# Patient Record
Sex: Female | Born: 1980
Health system: Southern US, Community
[De-identification: ages and names within clinical notes are randomized; demographics above are authoritative.]

## PROBLEM LIST (undated history)

## (undated) DIAGNOSIS — G43909 Migraine, unspecified, not intractable, without status migrainosus: Secondary | ICD-10-CM

## (undated) DIAGNOSIS — R87610 Atypical squamous cells of undetermined significance on cytologic smear of cervix (ASC-US): Secondary | ICD-10-CM

## (undated) DIAGNOSIS — Z1509 Genetic susceptibility to other malignant neoplasm: Principal | ICD-10-CM

## (undated) DIAGNOSIS — Z9289 Personal history of other medical treatment: Secondary | ICD-10-CM

## (undated) DIAGNOSIS — Z803 Family history of malignant neoplasm of breast: Secondary | ICD-10-CM

## (undated) DIAGNOSIS — Z1501 Genetic susceptibility to malignant neoplasm of breast: Principal | ICD-10-CM

## (undated) DIAGNOSIS — E559 Vitamin D deficiency, unspecified: Secondary | ICD-10-CM

## (undated) HISTORY — DX: Vitamin D deficiency, unspecified: E55.9

## (undated) HISTORY — DX: Personal history of other medical treatment: Z92.89

## (undated) HISTORY — DX: Atypical squamous cells of undetermined significance on cytologic smear of cervix (ASC-US): R87.610

## (undated) HISTORY — DX: Migraine, unspecified, not intractable, without status migrainosus: G43.909

## (undated) HISTORY — DX: Genetic susceptibility to malignant neoplasm of breast: Z15.01

## (undated) HISTORY — DX: Family history of malignant neoplasm of breast: Z80.3

## (undated) HISTORY — DX: Genetic susceptibility to other malignant neoplasm: Z15.09

---

## 1999-05-09 HISTORY — PX: CERVICAL BIOPSY  W/ LOOP ELECTRODE EXCISION: SUR135

## 2000-10-24 ENCOUNTER — Other Ambulatory Visit: Admission: RE | Admit: 2000-10-24 | Discharge: 2000-10-24 | Payer: Self-pay | Admitting: Obstetrics and Gynecology

## 2000-10-25 ENCOUNTER — Other Ambulatory Visit: Admission: RE | Admit: 2000-10-25 | Discharge: 2000-10-25 | Payer: Self-pay | Admitting: Obstetrics and Gynecology

## 2000-11-26 ENCOUNTER — Other Ambulatory Visit: Admission: RE | Admit: 2000-11-26 | Discharge: 2000-11-26 | Payer: Self-pay | Admitting: Obstetrics and Gynecology

## 2001-03-04 ENCOUNTER — Other Ambulatory Visit: Admission: RE | Admit: 2001-03-04 | Discharge: 2001-03-04 | Payer: Self-pay | Admitting: Obstetrics and Gynecology

## 2001-06-12 ENCOUNTER — Other Ambulatory Visit: Admission: RE | Admit: 2001-06-12 | Discharge: 2001-06-12 | Payer: Self-pay | Admitting: Obstetrics & Gynecology

## 2001-12-25 ENCOUNTER — Other Ambulatory Visit: Admission: RE | Admit: 2001-12-25 | Discharge: 2001-12-25 | Payer: Self-pay | Admitting: Obstetrics & Gynecology

## 2002-08-26 ENCOUNTER — Other Ambulatory Visit: Admission: RE | Admit: 2002-08-26 | Discharge: 2002-08-26 | Payer: Self-pay | Admitting: Obstetrics & Gynecology

## 2003-09-16 ENCOUNTER — Other Ambulatory Visit: Admission: RE | Admit: 2003-09-16 | Discharge: 2003-09-16 | Payer: Self-pay | Admitting: Obstetrics and Gynecology

## 2005-02-16 ENCOUNTER — Observation Stay: Payer: Self-pay

## 2005-02-19 ENCOUNTER — Observation Stay: Payer: Self-pay | Admitting: Obstetrics & Gynecology

## 2005-02-25 ENCOUNTER — Inpatient Hospital Stay: Payer: Self-pay | Admitting: Unknown Physician Specialty

## 2006-05-08 HISTORY — PX: TUBAL LIGATION: SHX77

## 2006-06-21 ENCOUNTER — Inpatient Hospital Stay: Payer: Self-pay | Admitting: Unknown Physician Specialty

## 2008-07-09 ENCOUNTER — Ambulatory Visit: Payer: Self-pay | Admitting: Unknown Physician Specialty

## 2011-04-20 ENCOUNTER — Ambulatory Visit: Payer: Self-pay

## 2011-05-09 HISTORY — PX: NOVASURE ABLATION: SHX5394

## 2011-12-04 ENCOUNTER — Ambulatory Visit: Payer: Self-pay

## 2011-12-08 ENCOUNTER — Ambulatory Visit: Payer: Self-pay

## 2011-12-08 LAB — PREGNANCY, URINE: Pregnancy Test, Urine: NEGATIVE m[IU]/mL

## 2011-12-14 LAB — PATHOLOGY REPORT

## 2012-05-08 DIAGNOSIS — Z1501 Genetic susceptibility to malignant neoplasm of breast: Secondary | ICD-10-CM | POA: Insufficient documentation

## 2012-05-08 HISTORY — DX: Genetic susceptibility to malignant neoplasm of breast: Z15.01

## 2014-06-04 LAB — HM PAP SMEAR: HM Pap smear: NEGATIVE

## 2014-07-16 ENCOUNTER — Ambulatory Visit (INDEPENDENT_AMBULATORY_CARE_PROVIDER_SITE_OTHER): Payer: 59 | Admitting: General Surgery

## 2014-07-16 ENCOUNTER — Encounter: Payer: Self-pay | Admitting: General Surgery

## 2014-07-16 VITALS — BP 132/70 | HR 62 | Resp 12 | Ht 64.0 in | Wt 162.0 lb

## 2014-07-16 DIAGNOSIS — Z1502 Genetic susceptibility to malignant neoplasm of ovary: Secondary | ICD-10-CM | POA: Diagnosis not present

## 2014-07-16 DIAGNOSIS — Z1501 Genetic susceptibility to malignant neoplasm of breast: Secondary | ICD-10-CM | POA: Diagnosis not present

## 2014-07-16 DIAGNOSIS — Z1509 Genetic susceptibility to other malignant neoplasm: Secondary | ICD-10-CM

## 2014-07-16 DIAGNOSIS — R1011 Right upper quadrant pain: Secondary | ICD-10-CM | POA: Diagnosis not present

## 2014-07-16 NOTE — Patient Instructions (Addendum)
The patient is aware to call back for any questions or concerns.  Abdominal ultrasound Laparoscopic Cholecystectomy Laparoscopic cholecystectomy is surgery to remove the gallbladder. The gallbladder is located in the upper right part of the abdomen, behind the liver. It is a storage sac for bile produced in the liver. Bile aids in the digestion and absorption of fats. Cholecystectomy is often done for inflammation of the gallbladder (cholecystitis). This condition is usually caused by a buildup of gallstones (cholelithiasis) in your gallbladder. Gallstones can block the flow of bile, resulting in inflammation and pain. In severe cases, emergency surgery may be required. When emergency surgery is not required, you will have time to prepare for the procedure. Laparoscopic surgery is an alternative to open surgery. Laparoscopic surgery has a shorter recovery time. Your common bile duct may also need to be examined during the procedure. If stones are found in the common bile duct, they may be removed. LET [facility] CARE PROVIDER KNOW ABOUT:  Any allergies you have.  All medicines you are taking, including vitamins, herbs, eye drops, creams, and over-the-counter medicines.  Previous problems you or members of your family have had with the use of anesthetics.  Any blood disorders you have.  Previous surgeries you have had.  Medical conditions you have. RISKS AND COMPLICATIONS Generally, this is a safe procedure. However, as with any procedure, complications can occur. Possible complications include:  Infection.  Damage to the common bile duct, nerves, arteries, veins, or other internal organs such as the stomach, liver, or intestines.  Bleeding.  A stone may remain in the common bile duct.  A bile leak from the cyst duct that is clipped when your gallbladder is removed.  The need to convert to open surgery, which requires a larger incision in the abdomen. This may be necessary if your  surgeon thinks it is not safe to continue with a laparoscopic procedure. BEFORE THE PROCEDURE  Ask your health care provider about changing or stopping any regular medicines. You will need to stop taking aspirin or blood thinners at least 5 days prior to surgery.  Do not eat or drink anything after midnight the night before surgery.  Let your health care provider know if you develop a cold or other infectious problem before surgery. PROCEDURE   You will be given medicine to make you sleep through the procedure (general anesthetic). A breathing tube will be placed in your mouth.  When you are asleep, your surgeon will make several small cuts (incisions) in your abdomen.  A thin, lighted tube with a tiny camera on the end (laparoscope) is inserted through one of the small incisions. The camera on the laparoscope sends a picture to a TV screen in the operating room. This gives the surgeon a good view inside your abdomen.  A gas will be pumped into your abdomen. This expands your abdomen so that the surgeon has more room to perform the surgery.  Other tools needed for the procedure are inserted through the other incisions. The gallbladder is removed through one of the incisions.  After the removal of your gallbladder, the incisions will be closed with stitches, staples, or skin glue. AFTER THE PROCEDURE  You will be taken to a recovery area where your progress will be checked often.  You may be allowed to go home the same day if your pain is controlled and you can tolerate liquids. Document Released: 04/24/2005 Document Revised: 02/12/2013 Document Reviewed: 12/04/2012 [facility] Patient Information 2015 [place], [place]. This information  is not intended to replace advice given to you by your health care provider. Make sure you discuss any questions you have with your health care provider.  

## 2014-07-16 NOTE — Progress Notes (Addendum)
Patient ID: Shannon George, female   DOB: November 03, 1980, 34 y.o.   MRN: [id number]  Chief Complaint  Patient presents with  . Abdominal Pain    HPI Shannon George is a 34 y.o. female.  Here today for evaluation of right upper quadrant abdominal pain possibly related to her gall bladder. She states she had a "gall bladder attack" Monday 29 and a few since then but nothing like the first attack. Occurred around lunchtime and she had eaten Chick-[employer] Not associated with foods. On Monday she had nausea but no vomiting. No xray's completed. Her mom had breast cancer age 55 and 64.  The patient was genetic positive followed by [employer], NMW.  HPI  Past Medical History  Diagnosis Date  . Migraine   . BRCA gene positive [address], CNM, [facility].    Past Surgical History  Procedure Laterality Date  . Cesarean section  2006, 2008  . Novasure ablation  2013    Family History  Problem Relation Age of Onset  . Cancer Father     lung  . Cancer Mother     breast, 87 and 64    Social History History  Substance Use Topics  . Smoking status: Never Smoker   . Smokeless tobacco: Never Used  . Alcohol Use: No    Allergies  Allergen Reactions  . Penicillins Rash    Current Outpatient Prescriptions  Medication Sig Dispense Refill  . ibuprofen (ADVIL,MOTRIN) 200 MG tablet Take 800 mg by mouth every 6 (six) hours as needed.    . SUMAtriptan (IMITREX) 50 MG tablet Take 50 mg by mouth every 2 (two) hours as needed for migraine. May repeat in 2 hours if headache persists or recurs.     No current facility-administered medications for this visit.    Review of Systems Review of Systems  Constitutional: Negative.   Respiratory: Negative.   Cardiovascular: Negative.   Gastrointestinal: Positive for nausea and abdominal pain. Negative for vomiting, diarrhea, constipation and abdominal distention.    Blood pressure 132/70, pulse 62, resp. rate 12, height _0   (1.626 m), weight 162 lb (73.483 kg), last menstrual period 01/07/2012.  Physical Exam Physical Exam  Constitutional: She is oriented to person, place, and time. She appears well-developed and well-nourished.  Neck: Neck supple.  Cardiovascular: Normal rate, regular rhythm and normal heart sounds.   Pulmonary/Chest: Effort normal and breath sounds normal.  Abdominal: Soft. Normal appearance. There is tenderness in the right upper quadrant.  Lymphadenopathy:    She has no cervical adenopathy.  Neurological: She is alert and oriented to person, place, and time.  Skin: Skin is warm and dry.    Data Reviewed None.  Assessment    History suggestive of biliary colic.    Plan    An abdominal ultrasound will be obtained. If this is notable for cholelithiasis, options for management including can elective cholecystectomy versus dietary modification will be in place.  Should the patient proceed to cholecystectomy, he would be prudent to visualize the ovaries at that time.    Patient to be scheduled for an abdominal ultrasound attn: gallbladder. Due to the late hour, patient will be contacted tomorrow with a day and time when this will take place.   Laparoscopic Cholecystectomy with Intraoperative Cholangiogram. The procedure, including it's potential risks and complications (including but not limited to infection, bleeding, injury to intra-abdominal organs or bile ducts, bile leak, poor cosmetic result, sepsis and death) were discussed  with the patient in detail. Non-operative options, including their inherent risks (acute calculous cholecystitis with possible choledocholithiasis or gallstone pancreatitis, with the risk of ascending cholangitis, sepsis, and death) were discussed as well. The patient expressed and understanding of what we discussed and wishes to proceed with laparoscopic cholecystectomy. The patient further understands that if it is technically not possible, or it is unsafe to  proceed laparoscopically, that I will convert to an open cholecystectomy.  A prescription for Norco 5/325, #30 with the inscription 1-2 by mouth every 4 hours when necessary for pain was provided in the event the patient experienced another severe episode of pain pending diagnostic workup. PCP:  Vevelyn Francois REF: Dr Wynona Luna, [place] 07/18/2014, 8:53 AM

## 2014-07-17 ENCOUNTER — Telehealth: Payer: Self-pay | Admitting: *Deleted

## 2014-07-17 NOTE — Telephone Encounter (Signed)
Patient has been scheduled for a limited abdominal ultrasound at [facility] for 07-22-14 at 8:45 am (arrive 8:30 am). Prep: NPO after midnight.  This patient is aware of date, time, and instructions. She verbalizes understanding.

## 2014-07-18 ENCOUNTER — Encounter: Payer: Self-pay | Admitting: General Surgery

## 2014-07-18 DIAGNOSIS — Z1501 Genetic susceptibility to malignant neoplasm of breast: Secondary | ICD-10-CM | POA: Insufficient documentation

## 2014-07-18 DIAGNOSIS — R1011 Right upper quadrant pain: Secondary | ICD-10-CM | POA: Insufficient documentation

## 2014-07-18 DIAGNOSIS — Z1509 Genetic susceptibility to other malignant neoplasm: Secondary | ICD-10-CM

## 2014-07-22 ENCOUNTER — Ambulatory Visit: Payer: Self-pay | Admitting: General Surgery

## 2014-07-22 ENCOUNTER — Encounter: Payer: Self-pay | Admitting: General Surgery

## 2014-07-23 ENCOUNTER — Telehealth: Payer: Self-pay | Admitting: *Deleted

## 2014-07-23 NOTE — Telephone Encounter (Signed)
-----   Message from Robert Bellow, MD sent at 07/22/2014 10:03 PM EDT ----- These notify the patient at the abdominal ultrasound did not show stones. I would like her to have a HIDA scan with CCK. ----- Message -----    From: Augustin Schooling, CMA    Sent: 07/22/2014   1:39 PM      To: Robert Bellow, MD

## 2014-07-23 NOTE — Telephone Encounter (Signed)
Message for patient to call the office.  

## 2014-07-23 NOTE — Telephone Encounter (Signed)
Patient has been scheduled for a HIDA scan with CCK for next Tuesday, 07-28-14 at 7:30 am (arrive 7:15 am). Prep: NPO 6 hours prior including medications, no smoking, no gum, and no narcotic pain medications the morning of scan.  This patient is aware of date, time, and instructions. She verbalizes understanding.

## 2014-07-28 ENCOUNTER — Ambulatory Visit: Payer: Self-pay | Admitting: General Surgery

## 2014-08-04 ENCOUNTER — Ambulatory Visit: Payer: Self-pay | Admitting: General Surgery

## 2014-08-05 ENCOUNTER — Encounter: Payer: Self-pay | Admitting: General Surgery

## 2014-08-10 ENCOUNTER — Encounter: Payer: Self-pay | Admitting: General Surgery

## 2014-08-10 ENCOUNTER — Other Ambulatory Visit: Payer: Self-pay

## 2014-08-10 ENCOUNTER — Telehealth: Payer: Self-pay | Admitting: *Deleted

## 2014-08-10 DIAGNOSIS — R1011 Right upper quadrant pain: Secondary | ICD-10-CM

## 2014-08-10 MED ORDER — DICYCLOMINE HCL 20 MG PO TABS: 20.0000 mg | ORAL_TABLET | Freq: Four times a day (QID) | ORAL | Status: DC

## 2014-08-10 NOTE — Telephone Encounter (Signed)
Patient returned your call about wanting to start medication bentyl? She thought about it and would like to try it.

## 2014-08-10 NOTE — Progress Notes (Signed)
The patient's ultrasound was negative for cholelithiasis.  HIDA scan showed an ejection fraction of 88% with reproduction of her symptoms, pain 6-7/10 in the right upper quadrant. It is possible that the forceful contractions of the gallbladder are responsible for her pain. Prior to considering cholecystectomy, the patient was offered a trial of Bentyl, 20 mg by mouth 4 times a day for a 2 week course to see if this would relieve her symptoms and provide long-lasting relief after was good discontinued. She is decided this is a reasonable course and a prescription for the same will be sent to her pharmacy.  We'll arrange for a follow-up visit in 3 weeks to assess her response to the therapy.

## 2014-08-25 NOTE — Op Note (Signed)
PATIENT NAME:  Shannon George, Shannon George MR#:  [id number] DATE OF BIRTH:  [birth year]  DATE OF PROCEDURE:  12/08/2011  PREOPERATIVE DIAGNOSIS: Menorrhagia.   POSTOPERATIVE DIAGNOSIS: Menorrhagia.  PROCEDURES PERFORMED: Dilation and curettage, hysteroscopy, and NovaSure endometrial ablation.   SURGEON: Wonda Cheng. Laurey Morale, M.D.   OPERATIVE FINDINGS: Fairly good pelvic support. Uterus sounded to 8 cm, cervical length 4 cm, cavity length 4 cm, cavity width 4 cm, power 88 and time 1.59 minutes.   DESCRIPTION OF PROCEDURE: After adequate general anesthesia, the patient was prepped and draped in routine fashion. Examination under anesthesia revealed no abnormal masses. The cervix was grasped with a Edison Nasuti tenaculum and sounded to 8 cm. The cervix was dilated with ease. The uterine cavity was visualized and there were no obvious abnormalities. Uterine cavity was systematically curetted with return of a small amount of normal-appearing tissue. A NovaSure endometrial ablation was then performed with the above settings. The patient tolerated the procedure well and left the Operating Room in good condition. Sponge and needle counts were said to be correct at the end of the procedure.  ____________________________ Wonda Cheng. Laurey Morale, MD pjr:slb D: 12/08/2011 09:49:48 ET T: 12/08/2011 12:19:12 ET JOB#: [id number]  cc: Wonda Cheng. Laurey Morale, MD, <Dictator> Rosina Lowenstein MD ELECTRONICALLY SIGNED 12/10/2011 8:27

## 2014-08-31 ENCOUNTER — Ambulatory Visit: Payer: 59 | Admitting: General Surgery

## 2014-09-02 ENCOUNTER — Ambulatory Visit (INDEPENDENT_AMBULATORY_CARE_PROVIDER_SITE_OTHER): Payer: 59 | Admitting: General Surgery

## 2014-09-02 ENCOUNTER — Encounter: Payer: Self-pay | Admitting: General Surgery

## 2014-09-02 VITALS — BP 124/74 | HR 80 | Resp 12 | Ht 64.0 in | Wt 168.0 lb

## 2014-09-02 DIAGNOSIS — Z1501 Genetic susceptibility to malignant neoplasm of breast: Secondary | ICD-10-CM | POA: Diagnosis not present

## 2014-09-02 DIAGNOSIS — Z1502 Genetic susceptibility to malignant neoplasm of ovary: Secondary | ICD-10-CM

## 2014-09-02 DIAGNOSIS — Z1509 Genetic susceptibility to other malignant neoplasm: Secondary | ICD-10-CM

## 2014-09-02 DIAGNOSIS — R1011 Right upper quadrant pain: Secondary | ICD-10-CM | POA: Diagnosis not present

## 2014-09-02 MED ORDER — DICYCLOMINE HCL 20 MG PO TABS: 20.0000 mg | ORAL_TABLET | Freq: Four times a day (QID) | ORAL | Status: DC | PRN

## 2014-09-02 NOTE — Patient Instructions (Addendum)
Patient to only take the Bentyl as needed. Patient to return as needed.

## 2014-09-02 NOTE — Progress Notes (Signed)
Patient ID: Shannon George, female   DOB: 07-Dec-1980, 34 y.o.   MRN: [id number]  Chief Complaint  Patient presents with  . Follow-up    gallbladder    HPI [name] is a 34 y.o. female here today for a three week follow up gallbladder problems. She states she only had one attack since her last visit. She had been making use of Bentyl, 20 mg 4 times a day. The one episode of pain she had was of far last intensity and much shorter duration than in the past. Overall she is improved both in frequency of episodes and severity.    HPI  Past Medical History  Diagnosis Date  . Migraine   . BRCA gene positive [address] CNM, [facility].    Past Surgical History  Procedure Laterality Date  . Cesarean section  2006, 2008  . Novasure ablation  2013    Family History  Problem Relation Age of Onset  . Cancer Father     lung  . Cancer Mother     breast, 38 (medullary carcinoma, poorly differentiated) and 46    Social History History  Substance Use Topics  . Smoking status: Never Smoker   . Smokeless tobacco: Never Used  . Alcohol Use: No    Allergies  Allergen Reactions  . Penicillins Rash    Current Outpatient Prescriptions  Medication Sig Dispense Refill  . dicyclomine (BENTYL) 20 MG tablet Take 1 tablet (20 mg total) by mouth 4 (four) times daily as needed for spasms. Take 1 tabled four times a day for 2 weeks. 120 tablet 1  . fluticasone (FLONASE) 50 MCG/ACT nasal spray   2  . ibuprofen (ADVIL,MOTRIN) 200 MG tablet Take 800 mg by mouth every 6 (six) hours as needed.    . SUMAtriptan (IMITREX) 50 MG tablet Take 50 mg by mouth every 2 (two) hours as needed for migraine. May repeat in 2 hours if headache persists or recurs.     No current facility-administered medications for this visit.    Review of Systems Review of Systems  Constitutional: Negative.   Cardiovascular: Negative.     Blood pressure 124/74, pulse 80, resp. rate 12, height _0   (1.626 m), weight 168 lb (76.204 kg).  Physical Exam Physical Exam  Constitutional: She is oriented to person, place, and time. She appears well-developed and well-nourished.  Cardiovascular: Normal rate, regular rhythm and normal heart sounds.   Pulmonary/Chest: Effort normal and breath sounds normal.  Abdominal: Soft. Normal appearance and bowel sounds are normal. There is no tenderness.    Neurological: She is alert and oriented to person, place, and time.  Skin: Skin is warm and dry.    Data Reviewed HIDA scan showed an ejection fraction of 88% with CCK injection reproducing her symptoms.  Assessment    Hyperkinetic biliary tree.    Plan    We'll plan for the patient uses Bentyl only as needed. If this results in rapid resolution of her symptoms and she continues to have decreasing episodes and severity of right upper quadrant pain, no further intervention would be planned. Should she have persistent symptoms, cholecystectomy could be considered all of there is no guarantee that this will relieve her pain. Based on review of her clinical symptoms I think there is little benefit from upper and/or lower endoscopy at this time.      PCP:  Theda Sers 09/03/2014, 1:07 PM

## 2015-04-09 ENCOUNTER — Other Ambulatory Visit: Payer: Self-pay | Admitting: Certified Nurse Midwife

## 2015-04-09 DIAGNOSIS — Z1231 Encounter for screening mammogram for malignant neoplasm of breast: Secondary | ICD-10-CM

## 2015-05-07 ENCOUNTER — Inpatient Hospital Stay
Admission: RE | Admit: 2015-05-07 | Discharge: 2015-05-07 | Disposition: A | Discharge status: Home / Self Care | Payer: Self-pay | Source: Ambulatory Visit | Attending: *Deleted | Admitting: *Deleted

## 2015-05-07 ENCOUNTER — Other Ambulatory Visit: Payer: Self-pay | Admitting: *Deleted

## 2015-05-07 DIAGNOSIS — Z9289 Personal history of other medical treatment: Secondary | ICD-10-CM

## 2015-05-19 ENCOUNTER — Ambulatory Visit
Admission: RE | Admit: 2015-05-19 | Discharge: 2015-05-19 | Disposition: A | Discharge status: Home / Self Care | Payer: Commercial Managed Care - HMO | Source: Ambulatory Visit | Attending: Certified Nurse Midwife | Admitting: Certified Nurse Midwife

## 2015-05-19 ENCOUNTER — Other Ambulatory Visit: Payer: Self-pay | Admitting: Certified Nurse Midwife

## 2015-05-19 DIAGNOSIS — Z1231 Encounter for screening mammogram for malignant neoplasm of breast: Secondary | ICD-10-CM | POA: Diagnosis present

## 2016-02-16 DIAGNOSIS — N390 Urinary tract infection, site not specified: Secondary | ICD-10-CM | POA: Diagnosis not present

## 2016-03-16 DIAGNOSIS — Z1502 Genetic susceptibility to malignant neoplasm of ovary: Secondary | ICD-10-CM | POA: Diagnosis not present

## 2016-03-16 DIAGNOSIS — Z01419 Encounter for gynecological examination (general) (routine) without abnormal findings: Secondary | ICD-10-CM | POA: Diagnosis not present

## 2016-03-16 DIAGNOSIS — Z1321 Encounter for screening for nutritional disorder: Secondary | ICD-10-CM | POA: Diagnosis not present

## 2016-03-16 DIAGNOSIS — Z1501 Genetic susceptibility to malignant neoplasm of breast: Secondary | ICD-10-CM | POA: Diagnosis not present

## 2016-03-16 DIAGNOSIS — Z0000 Encounter for general adult medical examination without abnormal findings: Secondary | ICD-10-CM | POA: Diagnosis not present

## 2016-03-16 DIAGNOSIS — Z13220 Encounter for screening for lipoid disorders: Secondary | ICD-10-CM | POA: Diagnosis not present

## 2016-05-16 DIAGNOSIS — R3915 Urgency of urination: Secondary | ICD-10-CM | POA: Diagnosis not present

## 2016-05-16 DIAGNOSIS — Z1321 Encounter for screening for nutritional disorder: Secondary | ICD-10-CM | POA: Diagnosis not present

## 2016-05-16 DIAGNOSIS — Z13220 Encounter for screening for lipoid disorders: Secondary | ICD-10-CM | POA: Diagnosis not present

## 2016-05-16 DIAGNOSIS — Z1502 Genetic susceptibility to malignant neoplasm of ovary: Secondary | ICD-10-CM | POA: Diagnosis not present

## 2016-05-16 DIAGNOSIS — Z0000 Encounter for general adult medical examination without abnormal findings: Secondary | ICD-10-CM | POA: Diagnosis not present

## 2016-05-16 DIAGNOSIS — Z1501 Genetic susceptibility to malignant neoplasm of breast: Secondary | ICD-10-CM | POA: Diagnosis not present

## 2016-05-16 DIAGNOSIS — Z01419 Encounter for gynecological examination (general) (routine) without abnormal findings: Secondary | ICD-10-CM | POA: Diagnosis not present

## 2016-05-18 ENCOUNTER — Other Ambulatory Visit: Payer: Self-pay | Admitting: Obstetrics and Gynecology

## 2016-05-18 DIAGNOSIS — Z1231 Encounter for screening mammogram for malignant neoplasm of breast: Secondary | ICD-10-CM

## 2016-05-19 ENCOUNTER — Ambulatory Visit
Admission: RE | Admit: 2016-05-19 | Discharge: 2016-05-19 | Disposition: A | Discharge status: Home / Self Care | Payer: 59 | Source: Ambulatory Visit | Attending: Obstetrics and Gynecology | Admitting: Obstetrics and Gynecology

## 2016-05-19 DIAGNOSIS — Z1231 Encounter for screening mammogram for malignant neoplasm of breast: Secondary | ICD-10-CM | POA: Diagnosis not present

## 2016-05-19 LAB — HM MAMMOGRAPHY

## 2016-05-22 DIAGNOSIS — Z1502 Genetic susceptibility to malignant neoplasm of ovary: Secondary | ICD-10-CM | POA: Diagnosis not present

## 2016-05-22 DIAGNOSIS — Z1501 Genetic susceptibility to malignant neoplasm of breast: Secondary | ICD-10-CM | POA: Diagnosis not present

## 2016-08-16 DIAGNOSIS — E559 Vitamin D deficiency, unspecified: Secondary | ICD-10-CM | POA: Diagnosis not present

## 2016-11-16 ENCOUNTER — Other Ambulatory Visit: Payer: Self-pay | Admitting: Obstetrics and Gynecology

## 2016-11-16 DIAGNOSIS — Z803 Family history of malignant neoplasm of breast: Secondary | ICD-10-CM

## 2016-11-20 ENCOUNTER — Ambulatory Visit (INDEPENDENT_AMBULATORY_CARE_PROVIDER_SITE_OTHER): Payer: 59 | Admitting: Obstetrics and Gynecology

## 2016-11-20 ENCOUNTER — Encounter: Payer: Self-pay | Admitting: Obstetrics and Gynecology

## 2016-11-20 ENCOUNTER — Ambulatory Visit (INDEPENDENT_AMBULATORY_CARE_PROVIDER_SITE_OTHER): Payer: 59

## 2016-11-20 VITALS — BP 100/60 | HR 74 | Ht 64.0 in | Wt 184.0 lb

## 2016-11-20 DIAGNOSIS — Z1501 Genetic susceptibility to malignant neoplasm of breast: Secondary | ICD-10-CM

## 2016-11-20 DIAGNOSIS — Z803 Family history of malignant neoplasm of breast: Secondary | ICD-10-CM | POA: Diagnosis not present

## 2016-11-20 DIAGNOSIS — Z1509 Genetic susceptibility to other malignant neoplasm: Secondary | ICD-10-CM

## 2016-11-20 DIAGNOSIS — Z9189 Other specified personal risk factors, not elsewhere classified: Secondary | ICD-10-CM

## 2016-11-20 DIAGNOSIS — G4700 Insomnia, unspecified: Secondary | ICD-10-CM | POA: Diagnosis not present

## 2016-11-20 DIAGNOSIS — Z8041 Family history of malignant neoplasm of ovary: Secondary | ICD-10-CM

## 2016-11-20 MED ORDER — ZOLPIDEM TARTRATE 5 MG PO TABS: 5.0000 mg | ORAL_TABLET | Freq: Every evening | ORAL | 0 refills | Status: DC | PRN

## 2016-11-20 NOTE — Progress Notes (Signed)
Chief Complaint  Patient presents with  . Follow-up    f/u GYN u/s and breast exam    HPI:      Shannon George is a 36 y.o. [id number] who LMP was No LMP recorded. Patient has had an ablation., presents today for clinical breast exam due to BRCA 1 positive gene mutation. She had a neg CBE 11/17 at her annual exam. She had a neg mammo 05/19/16. She had adequate Vit D levels 4/18. She had a neg GYN u/s and ca-125 1/18.  She had a GYN u/s today.   She had a recent death in her family and is having trouble sleeping. She would like a Rx for [employer]. She has taken it before without side effects.  Patient Active Problem List   Diagnosis Date Noted  . Abdominal pain, right upper quadrant 07/18/2014  . BRCA gene positive 07/18/2014  . BRCA1 gene mutation positive 05/08/2012    Family History  Problem Relation Age of Onset  . Cancer Father 2       lung  . Hypertension Mother   . Breast cancer Mother        11 (medullary carcinoma, poorly differentiated) and 94  . Breast cancer Paternal Aunt 39  . Breast cancer Maternal Grandmother 44  . Cancer Paternal Grandmother        BLADDER; KIDNEY  . Cancer Maternal Grandfather        STOMACH    Social History   Social History  . Marital status: Married    Spouse name: [name]  . Number of children: 2  . Years of education: 13   Occupational History  . [employer]     [employer]   Social History Main Topics  . Smoking status: Never Smoker  . Smokeless tobacco: Never Used  . Alcohol use No  . Drug use: No  . Sexual activity: Yes    Birth control/ protection: Pill   Other Topics Concern  . Not on file   Social History Narrative  . No narrative on file     Current Outpatient Prescriptions:  .  dicyclomine (BENTYL) 20 MG tablet, Take 1 tablet (20 mg total) by mouth 4 (four) times daily as needed for spasms. Take 1 tabled four times a day for 2 weeks., Disp: 120 tablet, Rfl: 1 .  fluticasone (FLONASE) 50 MCG/ACT nasal  spray, , Disp: , Rfl: 2 .  ibuprofen (ADVIL,MOTRIN) 200 MG tablet, Take 800 mg by mouth every 6 (six) hours as needed., Disp: , Rfl:  .  Norethindrone-Ethinyl Estradiol-Fe Biphas (LO LOESTRIN FE) 1 MG-10 MCG / 10 MCG tablet, Take 1 tablet by mouth daily., Disp: , Rfl:  .  SUMAtriptan (IMITREX) 50 MG tablet, Take 50 mg by mouth every 2 (two) hours as needed for migraine. May repeat in 2 hours if headache persists or recurs., Disp: , Rfl:  .  zolpidem (AMBIEN) 5 MG tablet, Take 1 tablet (5 mg total) by mouth at bedtime as needed for sleep., Disp: 20 tablet, Rfl: 0  Review of Systems  Constitutional: Negative for fever.  Gastrointestinal: Negative for blood in stool, constipation, diarrhea, nausea and vomiting.  Genitourinary: Negative for dyspareunia, dysuria, flank pain, frequency, hematuria, urgency, vaginal bleeding, vaginal discharge and vaginal pain.  Musculoskeletal: Negative for back pain.  Skin: Negative for rash.     OBJECTIVE:   Vitals:  BP 100/60   Pulse 74   Ht '5\' 4"'  (1.626 m)   Wt 184 lb (83.5  kg)   BMI 31.58 kg/m   Physical Exam  Constitutional: She is oriented to person, place, and time and well-developed, well-nourished, and in no distress.  Pulmonary/Chest: Right breast exhibits no inverted nipple, no mass, no nipple discharge, no skin change and no tenderness. Left breast exhibits no inverted nipple, no mass, no nipple discharge, no skin change and no tenderness. Breasts are symmetrical.  Abdominal: Soft. Normal appearance. There is no tenderness. There is no rebound and no guarding.  Lymphadenopathy:    She has no axillary adenopathy.  Neurological: She is alert and oriented to person, place, and time.  Psychiatric: Affect and judgment normal.  Vitals reviewed.   Results: GYN U/S-->EM=3.34 MM; BILAT OVAR WNL; NO FF IN CDS  Assessment/Plan: BRCA1 gene mutation positive - Neg GYN u/s today, neg CBE. Pt due for mammo 1/19. Pt no longer qualifies for scr breast  mammo this yr but should have Qyr, staggered with mammo. Ca-125 today - Plan: CA 125  Increased risk of breast cancer  Insomnia, unspecified type - Rx ambien. F/u prn. Use sparingly. - Plan: zolpidem (AMBIEN) 5 MG tablet     Meds ordered this encounter  Medications  . zolpidem (AMBIEN) 5 MG tablet    Sig: Take 1 tablet (5 mg total) by mouth at bedtime as needed for sleep.    Dispense:  20 tablet    Refill:  0      Return in about 6 months (around 4/91/7915) for annual.  Chaunice Obie B. Griffen Frayne, PA-C 11/20/2016 11:00 AM

## 2016-11-21 LAB — CA 125: Cancer Antigen (CA) 125: 27.8 U/mL (ref 0.0–38.1)

## 2017-01-16 ENCOUNTER — Other Ambulatory Visit: Payer: Self-pay | Admitting: Obstetrics and Gynecology

## 2017-01-16 DIAGNOSIS — G4700 Insomnia, unspecified: Secondary | ICD-10-CM

## 2017-01-16 MED ORDER — ZOLPIDEM TARTRATE 5 MG PO TABS: 5.0000 mg | ORAL_TABLET | Freq: Every evening | ORAL | 0 refills | Status: DC | PRN

## 2017-01-16 NOTE — Progress Notes (Signed)
Rx RF for insomnia.

## 2017-02-27 DIAGNOSIS — H5213 Myopia, bilateral: Secondary | ICD-10-CM | POA: Diagnosis not present

## 2017-02-28 ENCOUNTER — Other Ambulatory Visit: Payer: Self-pay | Admitting: Obstetrics and Gynecology

## 2017-02-28 DIAGNOSIS — G4700 Insomnia, unspecified: Secondary | ICD-10-CM

## 2017-02-28 MED ORDER — ZOLPIDEM TARTRATE 5 MG PO TABS: 5.0000 mg | ORAL_TABLET | Freq: Every evening | ORAL | 0 refills | Status: DC | PRN

## 2017-02-28 NOTE — Progress Notes (Signed)
Rx RF. 

## 2017-03-20 ENCOUNTER — Encounter: Payer: Self-pay | Admitting: Obstetrics and Gynecology

## 2017-03-20 ENCOUNTER — Ambulatory Visit (INDEPENDENT_AMBULATORY_CARE_PROVIDER_SITE_OTHER): Payer: 59 | Admitting: Obstetrics and Gynecology

## 2017-03-20 VITALS — BP 100/60 | HR 69 | Ht 64.0 in | Wt 185.0 lb

## 2017-03-20 DIAGNOSIS — Z1501 Genetic susceptibility to malignant neoplasm of breast: Secondary | ICD-10-CM | POA: Diagnosis not present

## 2017-03-20 DIAGNOSIS — F419 Anxiety disorder, unspecified: Secondary | ICD-10-CM | POA: Diagnosis not present

## 2017-03-20 DIAGNOSIS — Z3041 Encounter for surveillance of contraceptive pills: Secondary | ICD-10-CM

## 2017-03-20 DIAGNOSIS — Z1231 Encounter for screening mammogram for malignant neoplasm of breast: Secondary | ICD-10-CM | POA: Diagnosis not present

## 2017-03-20 DIAGNOSIS — Z01419 Encounter for gynecological examination (general) (routine) without abnormal findings: Secondary | ICD-10-CM

## 2017-03-20 DIAGNOSIS — Z1509 Genetic susceptibility to other malignant neoplasm: Secondary | ICD-10-CM

## 2017-03-20 DIAGNOSIS — Z1239 Encounter for other screening for malignant neoplasm of breast: Secondary | ICD-10-CM

## 2017-03-20 MED ORDER — IBUPROFEN 800 MG PO TABS: 800.0000 mg | ORAL_TABLET | Freq: Three times a day (TID) | ORAL | 1 refills | Status: DC | PRN

## 2017-03-20 MED ORDER — LORAZEPAM 0.5 MG PO TABS: 0.5000 mg | ORAL_TABLET | Freq: Three times a day (TID) | ORAL | 0 refills | Status: DC | PRN

## 2017-03-20 NOTE — Patient Instructions (Signed)
I value your feedback and entrusting us with your care. If you get a [facility] patient survey, I would appreciate you taking the time to let us know about your experience today. Thank you! 

## 2017-03-20 NOTE — Progress Notes (Signed)
PCP:  Vevelyn Francois, NP   Chief Complaint  Patient presents with  . Gynecologic Exam     HPI:      [name] is a 36 y.o. [id number] who LMP was No LMP recorded. Patient has had an ablation., presents today for her annual examination.  Her menses are absent due to endometrial ablation. She does occsa have intermenstrual bleeding.  Sex activity: single partner, contraception - tubal ligation.  Last Pap: May 05, 2015  Results were: no abnormalities /neg HPV DNA 12/15. Hx of STDs: HPV on cx  Last mammogram: May 19, 2016  Results were: normal--routine follow-up in 12 months There is a FH of breast cancer in her mom, MGM, and pat aunt. There is no FH of ovarian cancer. The patient does do self-breast exams. PT IS BRCA 1 POS. She is doing yearly mammo. Has not had a screening breast MRI. She is taking Vit D supp 5000 IU daily due to deficiency in past, normal level 4/18. She is taking OCPs for ovar cancer prevention and gets Q6 mo ca-125 and GYN u/s. Due 1/19 for both. She has not been interested in BSO in the past.   Tobacco use: The patient denies current or previous tobacco use. Alcohol use: none No drug use.  Exercise: moderately active  She does get adequate calcium and Vitamin D in her diet.  She complains of recent anxiety and difficulty sleeping due to fam stressors with her daughter. She is taking [place] sparingly with insomnia relief but feels she needs something for really bad days. She doesn't need something daily.   She needs Rx RF on ibup 800 mg.   She had normal lipids last yr.  Past Medical History:  Diagnosis Date  . BRCA1 gene mutation positive 2014   [facility]  . Family history of breast cancer   . History of Papanicolaou smear of cervix 04/24/13; 06/04/14   -/+; neg  . Migraine   . Pap smear abnormality of cervix with ASCUS favoring dysplasia   . Vitamin D deficiency     Past Surgical History:  Procedure Laterality Date  . CERVICAL  BIOPSY  W/ LOOP ELECTRODE EXCISION  2001   [place]  . CESAREAN SECTION  2006, 2008  . [place] ABLATION  2013   [facility]  . TUBAL LIGATION  2008    Family History  Problem Relation Age of Onset  . Cancer Father 13       lung  . Hypertension Mother   . Breast cancer Mother        12 (medullary carcinoma, poorly differentiated) and 58  . Breast cancer Paternal Aunt 20  . Breast cancer Maternal Grandmother 1  . Cancer Paternal Grandmother        BLADDER; KIDNEY  . Cancer Maternal Grandfather        STOMACH    Social History   Socioeconomic History  . Marital status: Married    Spouse name: [name]  . Number of children: 2  . Years of education: 49  . Highest education level: Not on file  Social Needs  . Financial resource strain: Not on file  . Food insecurity - worry: Not on file  . Food insecurity - inability: Not on file  . Transportation needs - medical: Not on file  . Transportation needs - non-medical: Not on file  Occupational History  . 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 Smoking status: Never  Smoker  . Smokeless tobacco: Never Used  Substance and Sexual Activity  . Alcohol use: No    Alcohol/week: 0.0 oz  . Drug use: No  . Sexual activity: Yes    Birth control/protection: None  Other Topics Concern  . Not on file  Social History Narrative  . Not on file    Current Meds  Medication Sig  . dicyclomine (BENTYL) 20 MG tablet Take 1 tablet (20 mg total) by mouth 4 (four) times daily as needed for spasms. Take 1 tabled four times a day for 2 weeks.  . fluticasone (FLONASE) 50 MCG/ACT nasal spray   . ibuprofen (ADVIL,MOTRIN) 800 MG tablet Take 1 tablet (800 mg total) every 8 (eight) hours as needed by mouth.  . SUMAtriptan (IMITREX) 50 MG tablet Take 50 mg by mouth every 2 (two) hours as needed for migraine. May repeat in 2 hours if headache persists or recurs.  Marland Kitchen zolpidem (AMBIEN) 5 MG tablet Take 1 tablet (5 mg total) by mouth at  bedtime as needed for sleep.  . [DISCONTINUED] ibuprofen (ADVIL,MOTRIN) 200 MG tablet Take 800 mg by mouth every 6 (six) hours as needed.     ROS:  Review of Systems  Constitutional: Negative for fatigue, fever and unexpected weight change.  Respiratory: Negative for cough, shortness of breath and wheezing.   Cardiovascular: Negative for chest pain, palpitations and leg swelling.  Gastrointestinal: Negative for blood in stool, constipation, diarrhea, nausea and vomiting.  Endocrine: Negative for cold intolerance, heat intolerance and polyuria.  Genitourinary: Negative for dyspareunia, dysuria, flank pain, frequency, genital sores, hematuria, menstrual problem, pelvic pain, urgency, vaginal bleeding, vaginal discharge and vaginal pain.  Musculoskeletal: Negative for back pain, joint swelling and myalgias.  Skin: Negative for rash.  Neurological: Positive for headaches. Negative for dizziness, syncope, light-headedness and numbness.  Hematological: Negative for adenopathy.  Psychiatric/Behavioral: Negative for agitation, confusion, sleep disturbance and suicidal ideas. The patient is not nervous/anxious.      Objective: BP 100/60   Pulse 69   Ht '5\' 4"'  (1.626 m)   Wt 185 lb (83.9 kg)   BMI 31.76 kg/m    Physical Exam  Constitutional: She is oriented to person, place, and time. She appears well-developed and well-nourished.  Genitourinary: Vagina normal and uterus normal. There is no rash or tenderness on the right labia. There is no rash or tenderness on the left labia. No erythema or tenderness in the vagina. No vaginal discharge found. Right adnexum does not display mass and does not display tenderness. Left adnexum does not display mass and does not display tenderness. Cervix does not exhibit motion tenderness or polyp. Uterus is not enlarged or tender.  Neck: Normal range of motion. No thyromegaly present.  Cardiovascular: Normal rate, regular rhythm and normal heart sounds.  No  murmur heard. Pulmonary/Chest: Effort normal and breath sounds normal. Right breast exhibits no mass, no nipple discharge, no skin change and no tenderness. Left breast exhibits no mass, no nipple discharge, no skin change and no tenderness.  Abdominal: Soft. There is no tenderness. There is no guarding.  Musculoskeletal: Normal range of motion.  Neurological: She is alert and oriented to person, place, and time. No cranial nerve deficit.  Psychiatric: She has a normal mood and affect. Her behavior is normal.  Vitals reviewed.   Assessment/Plan: Encounter for annual routine gynecological examination  Screening for breast cancer - Pt to sched mammo. Will f/u for scr breast MRI within 6 months of mammo. RTO in 6 months  for CBE. - Plan: MM SCREENING BREAST TOMO BILATERAL  Anxiety - Try Rx ativan sparingly. Do not take with ambien. F/u prn. - Plan: LORazepam (ATIVAN) 0.5 MG tablet  Encounter for surveillance of contraceptive pills - Pt on Lo Loestrin. Gets samples.  BRCA gene positive - Pt doing monthly SBE, Q6 mo CBE, yearly mammos and offered scr breast MRI. Cont Vit D supp. Pt on OCPs for ovar, getting Q6 mo GYN u/s and ca-125. - Plan: CA 125, VITAMIN D 25 Hydroxy (Vit-D Deficiency, Fractures), US PELVIS TRANSVANGINAL NON-OB (TV ONLY), MM SCREENING BREAST TOMO BILATERAL, CANCELED: CA 125, CANCELED: US PELVIS TRANSVANGINAL NON-OB (TV ONLY), CANCELED: VITAMIN D 25 Hydroxy (Vit-D Deficiency, Fractures) Prophylactic BSO offered to pt. Would still qualify for HRT.   Meds ordered this encounter  Medications  . LORazepam (ATIVAN) 0.5 MG tablet    Sig: Take 1 tablet (0.5 mg total) every 8 (eight) hours as needed by mouth for anxiety.    Dispense:  30 tablet    Refill:  0  . ibuprofen (ADVIL,MOTRIN) 800 MG tablet    Sig: Take 1 tablet (800 mg total) every 8 (eight) hours as needed by mouth.    Dispense:  30 tablet    Refill:  1             GYN counsel breast self exam, mammography screening,  use and side effects of OCP's, adequate intake of calcium and vitamin D, diet and exercise     F/U  Return in about 6 months (around 09/17/2017) for breast exam.  Elmo Putt B. Andy Moye, PA-C 03/20/2017 11:19 AM

## 2017-04-19 ENCOUNTER — Other Ambulatory Visit: Payer: Self-pay | Admitting: Obstetrics and Gynecology

## 2017-04-19 MED ORDER — BETAMETHASONE DIPROPIONATE 0.05 % EX CREA: TOPICAL_CREAM | Freq: Two times a day (BID) | CUTANEOUS | 0 refills | Status: DC

## 2017-04-19 NOTE — Progress Notes (Signed)
Rx crm for arm rash, question eczema.

## 2017-04-23 ENCOUNTER — Ambulatory Visit (INDEPENDENT_AMBULATORY_CARE_PROVIDER_SITE_OTHER): Payer: 59 | Admitting: Obstetrics & Gynecology

## 2017-04-23 ENCOUNTER — Encounter: Payer: Self-pay | Admitting: Obstetrics & Gynecology

## 2017-04-23 VITALS — BP 108/70 | HR 82 | Ht 64.0 in | Wt 183.0 lb

## 2017-04-23 DIAGNOSIS — E669 Obesity, unspecified: Secondary | ICD-10-CM

## 2017-04-23 MED ORDER — PHENTERMINE HCL 37.5 MG PO TABS: 37.5000 mg | ORAL_TABLET | Freq: Every day | ORAL | 1 refills | Status: DC

## 2017-04-23 NOTE — Progress Notes (Signed)
  HPI:  Patient is a 36 y.o. [id number] presenting for evaluation of abnormal weight gain.  The patient has gained 20 pounds over the past 2 years..  She feels she has gained weight due to problems with STRESS and her nutrition.  She has these associated symptoms: none.  She has tried prescription appetite suppressants: (Phentermine in past) and self-directed dieting in the past with some success. PMHx: She  has a past medical history of BRCA1 gene mutation positive (2014), Family history of breast cancer, History of Papanicolaou smear of cervix (04/24/13; 06/04/14), Migraine, Pap smear abnormality of cervix with ASCUS favoring dysplasia, and Vitamin D deficiency. Also,  has a past surgical history that includes [place] ablation (2013); Cesarean section (2006, 2008); Cervical biopsy w/ loop electrode excision (2001); and Tubal ligation (2008)., family history includes Breast cancer in her mother; Breast cancer (age of onset: 72) in her paternal aunt; Breast cancer (age of onset: 23) in her maternal grandmother; Cancer in her maternal grandfather and paternal grandmother; Cancer (age of onset: 23) in her father; Hypertension in her mother.,  reports that  has never smoked. she has never used smokeless tobacco. She reports that she does not drink alcohol or use drugs.  She has a current medication list which includes the following prescription(s): betamethasone dipropionate, dicyclomine, fluticasone, ibuprofen, lorazepam, norethindrone-ethinyl estradiol-fe biphas, phentermine, sumatriptan, and zolpidem. Also, is allergic to amoxicillin and penicillins.  Review of Systems  All other systems reviewed and are negative.  Objective: BP 108/70   Pulse 82   Ht 5' 4" (1.626 m)   Wt 183 lb (83 kg)   BMI 31.41 kg/m  Physical Exam  Constitutional: She is oriented to person, place, and time. She appears well-developed and well-nourished. No distress.  Musculoskeletal: Normal range of motion.  Neurological: She is  alert and oriented to person, place, and time.  Skin: Skin is warm and dry.  Psychiatric: She has a normal mood and affect.  Vitals reviewed.  ASSESSMENT:  obesity, BMI 31  Plan: Will assist patient in incorporating positive experiences into her life to promote a positive mental attitude.  Education given regarding appropriate lifestyle changes for weight loss, including regular physical activity, healthy coping strategies, caloric restriction, and healthy eating patterns.  Patient is started on prescription appetite suppressants: Phentermine.    The risks and benefits as well as side effects of medication, such as Phenteramine or [place], is discussed.  The pros and cons of suppressing appetite and boosting metabolism is counseled.  Risks of tolerance and addiction discussed.  Use of medicine will be short term.  Pt to call with any negative side effects and agrees to keep follow up appointments.  Barnett Applebaum, MD, [facility], [facility] 04/23/2017  11:51 AM

## 2017-05-15 ENCOUNTER — Ambulatory Visit (INDEPENDENT_AMBULATORY_CARE_PROVIDER_SITE_OTHER): Payer: 59

## 2017-05-15 DIAGNOSIS — Z1501 Genetic susceptibility to malignant neoplasm of breast: Secondary | ICD-10-CM

## 2017-05-15 DIAGNOSIS — Z1509 Genetic susceptibility to other malignant neoplasm: Secondary | ICD-10-CM

## 2017-05-23 ENCOUNTER — Other Ambulatory Visit: Payer: 59

## 2017-05-23 DIAGNOSIS — Z1501 Genetic susceptibility to malignant neoplasm of breast: Secondary | ICD-10-CM

## 2017-05-23 DIAGNOSIS — Z1509 Genetic susceptibility to other malignant neoplasm: Secondary | ICD-10-CM | POA: Diagnosis not present

## 2017-05-24 LAB — VITAMIN D 25 HYDROXY (VIT D DEFICIENCY, FRACTURES): Vit D, 25-Hydroxy: 30.6 ng/mL (ref 30.0–100.0)

## 2017-05-24 LAB — CA 125: CANCER ANTIGEN (CA) 125: 29.3 U/mL (ref 0.0–38.1)

## 2017-05-28 ENCOUNTER — Ambulatory Visit
Admission: RE | Admit: 2017-05-28 | Discharge: 2017-05-28 | Disposition: A | Discharge status: Home / Self Care | Payer: 59 | Source: Ambulatory Visit | Attending: Obstetrics and Gynecology | Admitting: Obstetrics and Gynecology

## 2017-05-28 DIAGNOSIS — Z1231 Encounter for screening mammogram for malignant neoplasm of breast: Secondary | ICD-10-CM | POA: Diagnosis not present

## 2017-05-28 DIAGNOSIS — R928 Other abnormal and inconclusive findings on diagnostic imaging of breast: Secondary | ICD-10-CM | POA: Diagnosis not present

## 2017-05-28 DIAGNOSIS — Z1509 Genetic susceptibility to other malignant neoplasm: Secondary | ICD-10-CM

## 2017-05-28 DIAGNOSIS — Z1501 Genetic susceptibility to malignant neoplasm of breast: Secondary | ICD-10-CM

## 2017-05-28 DIAGNOSIS — Z1239 Encounter for other screening for malignant neoplasm of breast: Secondary | ICD-10-CM

## 2017-05-29 ENCOUNTER — Other Ambulatory Visit: Payer: Self-pay | Admitting: Obstetrics and Gynecology

## 2017-05-29 DIAGNOSIS — N6489 Other specified disorders of breast: Secondary | ICD-10-CM

## 2017-05-29 DIAGNOSIS — R928 Other abnormal and inconclusive findings on diagnostic imaging of breast: Secondary | ICD-10-CM

## 2017-05-30 ENCOUNTER — Other Ambulatory Visit: Payer: Self-pay | Admitting: Obstetrics & Gynecology

## 2017-05-30 ENCOUNTER — Ambulatory Visit
Admission: RE | Admit: 2017-05-30 | Discharge: 2017-05-30 | Disposition: A | Discharge status: Home / Self Care | Payer: 59 | Source: Ambulatory Visit | Attending: Obstetrics and Gynecology | Admitting: Obstetrics and Gynecology

## 2017-05-30 ENCOUNTER — Other Ambulatory Visit: Payer: Self-pay | Admitting: Obstetrics and Gynecology

## 2017-05-30 DIAGNOSIS — R928 Other abnormal and inconclusive findings on diagnostic imaging of breast: Secondary | ICD-10-CM

## 2017-05-30 DIAGNOSIS — Z803 Family history of malignant neoplasm of breast: Secondary | ICD-10-CM | POA: Insufficient documentation

## 2017-05-30 DIAGNOSIS — N6489 Other specified disorders of breast: Secondary | ICD-10-CM | POA: Insufficient documentation

## 2017-05-30 DIAGNOSIS — Z1231 Encounter for screening mammogram for malignant neoplasm of breast: Secondary | ICD-10-CM

## 2017-05-30 MED ORDER — PHENTERMINE HCL 37.5 MG PO TABS: 37.5000 mg | ORAL_TABLET | Freq: Every day | ORAL | 1 refills | Status: DC

## 2017-05-30 NOTE — Progress Notes (Signed)
Breast MRI for abn mammo f/u/FH breast cancer/increased risk for breast cancer/BRCA 1 pos.

## 2017-05-30 NOTE — Progress Notes (Signed)
Weigh in- patient reports no side effects to Phentermine, doing well with weight loss; following healthy diet and exercise routine. Weight 173 lbs (down from 183 last month) Refill granted Barnett Applebaum, MD, [facility], [facility] 05/30/2017  11:21 AM

## 2017-06-13 ENCOUNTER — Ambulatory Visit
Admission: RE | Admit: 2017-06-13 | Discharge: 2017-06-13 | Disposition: A | Discharge status: Home / Self Care | Payer: 59 | Source: Ambulatory Visit | Attending: Obstetrics and Gynecology | Admitting: Obstetrics and Gynecology

## 2017-06-13 ENCOUNTER — Other Ambulatory Visit: Payer: Self-pay | Admitting: Obstetrics and Gynecology

## 2017-06-13 ENCOUNTER — Other Ambulatory Visit: Payer: Self-pay | Admitting: General Practice

## 2017-06-13 DIAGNOSIS — R928 Other abnormal and inconclusive findings on diagnostic imaging of breast: Secondary | ICD-10-CM

## 2017-06-13 DIAGNOSIS — N651 Disproportion of reconstructed breast: Secondary | ICD-10-CM | POA: Diagnosis not present

## 2017-06-13 DIAGNOSIS — Z1231 Encounter for screening mammogram for malignant neoplasm of breast: Secondary | ICD-10-CM

## 2017-06-13 MED ORDER — GADOBENATE DIMEGLUMINE 529 MG/ML IV SOLN
15.0000 mL | Freq: Once | INTRAVENOUS | Status: AC | PRN
  Administered 2017-06-13: 15 mL via INTRAVENOUS

## 2017-06-13 NOTE — Progress Notes (Signed)
Pt needs LT breast MRI bx due to Birads 4 MRI.

## 2017-06-13 NOTE — Progress Notes (Signed)
Order in there.

## 2017-06-13 NOTE — Addendum Note (Signed)
Addended by: Ardeth Perfect B on: 07/09/9492 03:50 PM   Modules accepted: Orders

## 2017-06-20 ENCOUNTER — Ambulatory Visit
Admission: RE | Admit: 2017-06-20 | Discharge: 2017-06-20 | Disposition: A | Discharge status: Home / Self Care | Payer: 59 | Source: Ambulatory Visit | Attending: Obstetrics and Gynecology | Admitting: Obstetrics and Gynecology

## 2017-06-20 DIAGNOSIS — N6489 Other specified disorders of breast: Secondary | ICD-10-CM | POA: Diagnosis not present

## 2017-06-20 DIAGNOSIS — R928 Other abnormal and inconclusive findings on diagnostic imaging of breast: Secondary | ICD-10-CM

## 2017-06-20 DIAGNOSIS — N6012 Diffuse cystic mastopathy of left breast: Secondary | ICD-10-CM | POA: Diagnosis not present

## 2017-06-20 HISTORY — PX: BREAST BIOPSY: SHX20

## 2017-06-20 MED ORDER — GADOBENATE DIMEGLUMINE 529 MG/ML IV SOLN
15.0000 mL | Freq: Once | INTRAVENOUS | Status: AC | PRN
  Administered 2017-06-20: 15 mL via INTRAVENOUS

## 2017-07-23 ENCOUNTER — Other Ambulatory Visit: Payer: Self-pay | Admitting: Obstetrics and Gynecology

## 2017-07-23 DIAGNOSIS — G4700 Insomnia, unspecified: Secondary | ICD-10-CM

## 2017-07-23 MED ORDER — ZOLPIDEM TARTRATE 5 MG PO TABS: 5.0000 mg | ORAL_TABLET | Freq: Every evening | ORAL | 0 refills | Status: DC | PRN

## 2017-07-23 NOTE — Progress Notes (Signed)
Rx RF. 

## 2017-08-20 ENCOUNTER — Other Ambulatory Visit: Payer: Self-pay | Admitting: Obstetrics and Gynecology

## 2017-08-20 ENCOUNTER — Other Ambulatory Visit: Payer: Self-pay | Admitting: Obstetrics & Gynecology

## 2017-08-20 DIAGNOSIS — G4700 Insomnia, unspecified: Secondary | ICD-10-CM

## 2017-08-20 MED ORDER — ZOLPIDEM TARTRATE 5 MG PO TABS: 5.0000 mg | ORAL_TABLET | Freq: Every evening | ORAL | 0 refills | Status: DC | PRN

## 2017-08-20 NOTE — Telephone Encounter (Signed)
Please advise for refill. Thank you.  

## 2017-08-30 ENCOUNTER — Other Ambulatory Visit: Payer: Self-pay | Admitting: Obstetrics and Gynecology

## 2017-08-30 DIAGNOSIS — G4700 Insomnia, unspecified: Secondary | ICD-10-CM

## 2017-08-30 MED ORDER — ZOLPIDEM TARTRATE 10 MG PO TABS: 5.0000 mg | ORAL_TABLET | Freq: Every evening | ORAL | 0 refills | Status: DC | PRN

## 2017-08-30 NOTE — Progress Notes (Signed)
Rx increase to 10 mg since 5 mg dose not working. Aware of concerns of 10 mg dose.  Pt is exercising, limiting caffeine.

## 2017-08-31 ENCOUNTER — Other Ambulatory Visit: Payer: Self-pay | Admitting: Obstetrics and Gynecology

## 2017-08-31 DIAGNOSIS — G4700 Insomnia, unspecified: Secondary | ICD-10-CM

## 2017-08-31 MED ORDER — ZOLPIDEM TARTRATE 10 MG PO TABS: 10.0000 mg | ORAL_TABLET | Freq: Every evening | ORAL | 0 refills | Status: DC | PRN

## 2017-09-27 ENCOUNTER — Other Ambulatory Visit: Payer: Self-pay

## 2017-09-27 DIAGNOSIS — G4700 Insomnia, unspecified: Secondary | ICD-10-CM

## 2017-09-27 MED ORDER — ZOLPIDEM TARTRATE 10 MG PO TABS: 10.0000 mg | ORAL_TABLET | Freq: Every evening | ORAL | 0 refills | Status: DC | PRN

## 2017-10-23 ENCOUNTER — Other Ambulatory Visit: Payer: Self-pay | Admitting: Obstetrics and Gynecology

## 2017-10-23 DIAGNOSIS — G4700 Insomnia, unspecified: Secondary | ICD-10-CM

## 2017-10-23 MED ORDER — ZOLPIDEM TARTRATE 10 MG PO TABS: 10.0000 mg | ORAL_TABLET | Freq: Every evening | ORAL | 1 refills | Status: DC | PRN

## 2017-10-25 ENCOUNTER — Other Ambulatory Visit: Payer: 59

## 2017-10-26 ENCOUNTER — Other Ambulatory Visit: Payer: Self-pay | Admitting: Obstetrics and Gynecology

## 2017-10-26 ENCOUNTER — Other Ambulatory Visit (INDEPENDENT_AMBULATORY_CARE_PROVIDER_SITE_OTHER): Payer: 59

## 2017-10-26 ENCOUNTER — Other Ambulatory Visit: Payer: 59

## 2017-10-26 DIAGNOSIS — N83209 Unspecified ovarian cyst, unspecified side: Secondary | ICD-10-CM

## 2017-10-30 ENCOUNTER — Other Ambulatory Visit: Payer: 59

## 2017-12-20 ENCOUNTER — Other Ambulatory Visit: Payer: Self-pay | Admitting: Obstetrics and Gynecology

## 2017-12-20 DIAGNOSIS — G4700 Insomnia, unspecified: Secondary | ICD-10-CM

## 2017-12-20 MED ORDER — ZOLPIDEM TARTRATE 10 MG PO TABS: 10.0000 mg | ORAL_TABLET | Freq: Every evening | ORAL | 1 refills | Status: DC | PRN

## 2017-12-20 NOTE — Progress Notes (Signed)
Rx RF. Discussed sleep hygiene/try to wean down use.

## 2018-01-10 ENCOUNTER — Other Ambulatory Visit: Payer: Self-pay | Admitting: Obstetrics and Gynecology

## 2018-01-10 DIAGNOSIS — Z1509 Genetic susceptibility to other malignant neoplasm: Principal | ICD-10-CM

## 2018-01-10 DIAGNOSIS — Z1501 Genetic susceptibility to malignant neoplasm of breast: Secondary | ICD-10-CM

## 2018-01-10 NOTE — Progress Notes (Signed)
Order for Q6 mo GYN u/s for BRCA gene pos.

## 2018-03-21 ENCOUNTER — Other Ambulatory Visit (HOSPITAL_COMMUNITY)
Admission: RE | Admit: 2018-03-21 | Discharge: 2018-03-21 | Disposition: A | Discharge status: Home / Self Care | Payer: 59 | Source: Ambulatory Visit | Attending: Obstetrics and Gynecology | Admitting: Obstetrics and Gynecology

## 2018-03-21 ENCOUNTER — Encounter: Payer: Self-pay | Admitting: Obstetrics and Gynecology

## 2018-03-21 ENCOUNTER — Ambulatory Visit (INDEPENDENT_AMBULATORY_CARE_PROVIDER_SITE_OTHER): Payer: 59 | Admitting: Obstetrics and Gynecology

## 2018-03-21 VITALS — BP 112/70 | HR 83 | Ht 64.0 in | Wt 184.0 lb

## 2018-03-21 DIAGNOSIS — Z1239 Encounter for other screening for malignant neoplasm of breast: Secondary | ICD-10-CM

## 2018-03-21 DIAGNOSIS — Z1509 Genetic susceptibility to other malignant neoplasm: Secondary | ICD-10-CM

## 2018-03-21 DIAGNOSIS — Z1501 Genetic susceptibility to malignant neoplasm of breast: Secondary | ICD-10-CM | POA: Diagnosis not present

## 2018-03-21 DIAGNOSIS — Z13220 Encounter for screening for lipoid disorders: Secondary | ICD-10-CM | POA: Diagnosis not present

## 2018-03-21 DIAGNOSIS — Z124 Encounter for screening for malignant neoplasm of cervix: Secondary | ICD-10-CM

## 2018-03-21 DIAGNOSIS — Z9189 Other specified personal risk factors, not elsewhere classified: Secondary | ICD-10-CM

## 2018-03-21 DIAGNOSIS — Z0000 Encounter for general adult medical examination without abnormal findings: Secondary | ICD-10-CM

## 2018-03-21 DIAGNOSIS — Z01419 Encounter for gynecological examination (general) (routine) without abnormal findings: Secondary | ICD-10-CM

## 2018-03-21 DIAGNOSIS — Z3041 Encounter for surveillance of contraceptive pills: Secondary | ICD-10-CM

## 2018-03-21 DIAGNOSIS — Z1321 Encounter for screening for nutritional disorder: Secondary | ICD-10-CM

## 2018-03-21 DIAGNOSIS — R928 Other abnormal and inconclusive findings on diagnostic imaging of breast: Secondary | ICD-10-CM

## 2018-03-21 DIAGNOSIS — Z1151 Encounter for screening for human papillomavirus (HPV): Secondary | ICD-10-CM | POA: Diagnosis not present

## 2018-03-21 MED ORDER — FLUTICASONE PROPIONATE 50 MCG/ACT NA SUSP: 1.0000 | Freq: Two times a day (BID) | NASAL | 2 refills | Status: DC | PRN

## 2018-03-21 MED ORDER — NORETHIN-ETH ESTRAD-FE BIPHAS 1 MG-10 MCG / 10 MCG PO TABS: 1.0000 | ORAL_TABLET | Freq: Every day | ORAL | 3 refills | Status: DC

## 2018-03-21 NOTE — Patient Instructions (Signed)
I value your feedback and entrusting us with your care. If you get a  patient survey, I would appreciate you taking the time to let us know about your experience today. Thank you!  [facility]: [phone]    

## 2018-03-21 NOTE — Progress Notes (Signed)
PCP:  Chad Cordial, PA-C   Chief Complaint  Patient presents with  . Gynecologic Exam     HPI:      [name] is a 37 y.o. [id number] who LMP was No LMP recorded. Patient has had an ablation., presents today for her annual examination.  Her menses are absent due to endometrial ablation. She does not have BTB. Is also on OCPs for BRCA 1 gene mutation.   Sex activity: single partner, contraception - tubal ligation.  Last Pap: May 05, 2015  Results were: no abnormalities /neg HPV DNA 12/15. Hx of STDs: HPV on cx  Last mammogram: 05/30/17  Results were inconclusive/Birads 0. Due to BRCA 1 gene mutation, breast MRI recommended to further eval LT breast. MRI 06/13/17 was Birads 4. Pt then had MRI LT breast bx that showed fibrocystic changes LT breast. Pt due for repeat mammo/u/s 8/19 but not done due to fam issues. Never rescheduled but able to do it now. There is a FH of breast cancer in her mom, MGM, and pat aunt. There is no FH of ovarian cancer.   PT IS BRCA 1 POS. The patient does do self-breast exams. She is doing yearly mammo and screening breast MRI, as well as CBE. She is taking Vit D supp 5000 IU daily due to deficiency in past, normal level 4/18 and 1/19. She is taking OCPs for ovar cancer prevention and gets Q6 mo ca-125 (1/19) and GYN u/s (6/19). Due 12/19 for both. She has not been interested in BSO, prophylactic mastectomy, or tamoxifen in the past, but is considering BSO now. Would still be candidate for HRT.   Tobacco use: The patient denies current or previous tobacco use. Alcohol use: none No drug use.  Exercise: moderately active  She does get adequate calcium and Vitamin D in her diet.  She was taking [place] regularly this past yr for insomnia but has been able to come off it. Is sleeping well on her own now. Sx had been stress-induced.   She needs Rx RF on flonase for allergies.  She had normal lipids 2017. Due to repeat labs this yr.  Past Medical  History:  Diagnosis Date  . BRCA1 gene mutation positive 2014   [facility]  . Family history of breast cancer   . History of Papanicolaou smear of cervix 04/24/13; 06/04/14   -/+; neg  . Migraine   . Pap smear abnormality of cervix with ASCUS favoring dysplasia   . Vitamin D deficiency     Past Surgical History:  Procedure Laterality Date  . CERVICAL BIOPSY  W/ LOOP ELECTRODE EXCISION  2001   [place]  . CESAREAN SECTION  2006, 2008  . [place] ABLATION  2013   [facility]  . TUBAL LIGATION  2008    Family History  Problem Relation Age of Onset  . Cancer Father 66       lung  . Hypertension Mother   . Breast cancer Mother        35 (medullary carcinoma, poorly differentiated) and 90  . Breast cancer Paternal Aunt 7  . Breast cancer Maternal Grandmother 70  . Cancer Paternal Grandmother        BLADDER; KIDNEY  . Cancer Maternal Grandfather        STOMACH    Social History   Socioeconomic History  . Marital status: Married    Spouse name: [name]  . Number of children: 2  . Years of education: 35  .  Highest education level: Not on file  Occupational History  . Occupation: MEDICAL RECORDS    Comment: [employer]  . Financial resource strain: Not on file  . Food insecurity:    Worry: Not on file    Inability: Not on file  . Transportation needs:    Medical: Not on file    Non-medical: Not on file  Tobacco Use  . Smoking status: Never Smoker  . Smokeless tobacco: Never Used  Substance and Sexual Activity  . Alcohol use: No    Alcohol/week: 0.0 standard drinks  . Drug use: No  . Sexual activity: Yes    Birth control/protection: None  Lifestyle  . Physical activity:    Days per week: Not on file    Minutes per session: Not on file  . Stress: Not on file  Relationships  . Social connections:    Talks on phone: Not on file    Gets together: Not on file    Attends religious service: Not on file    Active member of club or organization: Not on file      Attends meetings of clubs or organizations: Not on file    Relationship status: Not on file  . Intimate partner violence:    Fear of current or ex partner: Not on file    Emotionally abused: Not on file    Physically abused: Not on file    Forced sexual activity: Not on file  Other Topics Concern  . Not on file  Social History Narrative  . Not on file    Current Meds  Medication Sig  . dicyclomine (BENTYL) 20 MG tablet Take 1 tablet (20 mg total) by mouth 4 (four) times daily as needed for spasms. Take 1 tabled four times a day for 2 weeks.  . fluticasone (FLONASE) 50 MCG/ACT nasal spray Place 1 spray into both nostrils 2 (two) times daily as needed for allergies or rhinitis.  Marland Kitchen ibuprofen (ADVIL,MOTRIN) 800 MG tablet Take 1 tablet (800 mg total) every 8 (eight) hours as needed by mouth.  Marland Kitchen LORazepam (ATIVAN) 0.5 MG tablet Take 1 tablet (0.5 mg total) every 8 (eight) hours as needed by mouth for anxiety.  . Norethindrone-Ethinyl Estradiol-Fe Biphas (LO LOESTRIN FE) 1 MG-10 MCG / 10 MCG tablet Take 1 tablet by mouth daily. PT GETS SAMPLES  . SUMAtriptan (IMITREX) 50 MG tablet Take 50 mg by mouth every 2 (two) hours as needed for migraine. May repeat in 2 hours if headache persists or recurs.  . triamcinolone cream (KENALOG) 0.1 % Apply topically.  Marland Kitchen zolpidem (AMBIEN) 10 MG tablet Take 1 tablet (10 mg total) by mouth at bedtime as needed for sleep.  . [DISCONTINUED] fluticasone (FLONASE) 50 MCG/ACT nasal spray   . [DISCONTINUED] Norethindrone-Ethinyl Estradiol-Fe Biphas (LO LOESTRIN FE) 1 MG-10 MCG / 10 MCG tablet Take 1 tablet by mouth daily.     ROS:  Review of Systems  Constitutional: Negative for fatigue, fever and unexpected weight change.  Respiratory: Negative for cough, shortness of breath and wheezing.   Cardiovascular: Negative for chest pain, palpitations and leg swelling.  Gastrointestinal: Negative for blood in stool, constipation, diarrhea, nausea and vomiting.   Endocrine: Negative for cold intolerance, heat intolerance and polyuria.  Genitourinary: Negative for dyspareunia, dysuria, flank pain, frequency, genital sores, hematuria, menstrual problem, pelvic pain, urgency, vaginal bleeding, vaginal discharge and vaginal pain.  Musculoskeletal: Negative for back pain, joint swelling and myalgias.  Skin: Negative for rash.  Neurological:  Negative for dizziness, syncope, light-headedness, numbness and headaches.  Hematological: Negative for adenopathy.  Psychiatric/Behavioral: Negative for agitation, confusion, sleep disturbance and suicidal ideas. The patient is not nervous/anxious.      Objective: BP 112/70   Pulse 83   Ht 5' 4" (1.626 m)   Wt 184 lb (83.5 kg)   BMI 31.58 kg/m    Physical Exam  Constitutional: She is oriented to person, place, and time. She appears well-developed and well-nourished.  Genitourinary: Vagina normal and uterus normal. There is no rash or tenderness on the right labia. There is no rash or tenderness on the left labia. No erythema or tenderness in the vagina. No vaginal discharge found. Right adnexum does not display mass and does not display tenderness. Left adnexum does not display mass and does not display tenderness. Cervix does not exhibit motion tenderness or polyp. Uterus is not enlarged or tender.  Neck: Normal range of motion. No thyromegaly present.  Cardiovascular: Normal rate, regular rhythm and normal heart sounds.  No murmur heard. Pulmonary/Chest: Effort normal and breath sounds normal. Right breast exhibits no mass, no nipple discharge, no skin change and no tenderness. Left breast exhibits no mass, no nipple discharge, no skin change and no tenderness.  Abdominal: Soft. There is no tenderness. There is no guarding.  Musculoskeletal: Normal range of motion.  Neurological: She is alert and oriented to person, place, and time. No cranial nerve deficit.  Psychiatric: She has a normal mood and affect. Her  behavior is normal.  Vitals reviewed.   Assessment/Plan: Encounter for annual routine gynecological examination  Cervical cancer screening - Plan: Cytology - PAP  Screening for HPV (human papillomavirus) - Plan: Cytology - PAP  Screening for breast cancer - Pt to sched mammo f/u.   Abnormal mammogram of left breast - Pt due for dx LT breast mammo and u/s per 2/19 MRI bx report.  - Plan: US BREAST LTD UNI LEFT INC AXILLA, MM DIAG BREAST TOMO UNI LEFT  BRCA gene positive - Pt to cont monthly SBE, yearly CBE, mammo and scr breast MRI. Cont Vit D supp. Discussed mastectomy, BSO, tamoxifen. Pt to consider BSO. F/u with MD - Plan: CA 125, US BREAST LTD UNI LEFT INC AXILLA, MM DIAG BREAST TOMO UNI LEFT, Norethindrone-Ethinyl Estradiol-Fe Biphas (LO LOESTRIN FE) 1 MG-10 MCG / 10 MCG tablet  Increased risk of breast cancer - Plan: US BREAST LTD UNI LEFT INC AXILLA, MM DIAG BREAST TOMO UNI LEFT  Encounter for surveillance of contraceptive pills - Rx RF OCPs for ovar protection. Pt gets samples.  - Plan: Norethindrone-Ethinyl Estradiol-Fe Biphas (LO LOESTRIN FE) 1 MG-10 MCG / 10 MCG tablet  Blood tests for routine general physical examination - Plan: Comprehensive metabolic panel, Lipid panel, CA 125, VITAMIN D 25 Hydroxy (Vit-D Deficiency, Fractures)  Screening cholesterol level - Plan: Lipid panel  Encounter for vitamin deficiency screening - Plan: VITAMIN D 25 Hydroxy (Vit-D Deficiency, Fractures)   Meds ordered this encounter  Medications  . Norethindrone-Ethinyl Estradiol-Fe Biphas (LO LOESTRIN FE) 1 MG-10 MCG / 10 MCG tablet    Sig: Take 1 tablet by mouth daily. PT GETS SAMPLES    Dispense:  3 Package    Refill:  3    Order Specific Question:   Supervising Provider    Answer:   Gae Dry [id number]  . fluticasone (FLONASE) 50 MCG/ACT nasal spray    Sig: Place 1 spray into both nostrils 2 (two) times daily as needed for allergies or rhinitis.  Dispense:  16 g    Refill:  2     Order Specific Question:   Supervising Provider    Answer:   Gae Dry [[id number]]             GYN counsel breast self exam, mammography screening, use and side effects of OCP's, adequate intake of calcium and vitamin D, diet and exercise     F/U  Return in about 1 year (around 03/22/2019).  Alicia B. Copland, PA-C 03/21/2018 10:07 AM

## 2018-03-22 LAB — COMPREHENSIVE METABOLIC PANEL
ALBUMIN: 4.5 g/dL (ref 3.5–5.5)
ALT: 7 IU/L (ref 0–32)
AST: 14 IU/L (ref 0–40)
Albumin/Globulin Ratio: 2 (ref 1.2–2.2)
Alkaline Phosphatase: 41 IU/L (ref 39–117)
BUN / CREAT RATIO: 16 (ref 9–23)
BUN: 15 mg/dL (ref 6–20)
Bilirubin Total: 0.6 mg/dL (ref 0.0–1.2)
CO2: 23 mmol/L (ref 20–29)
CREATININE: 0.96 mg/dL (ref 0.57–1.00)
Calcium: 9.3 mg/dL (ref 8.7–10.2)
Chloride: 104 mmol/L (ref 96–106)
GFR calc non Af Amer: 76 mL/min/{1.73_m2} (ref >59)
GFR, EST AFRICAN AMERICAN: 87 mL/min/{1.73_m2} (ref >59)
GLUCOSE: 83 mg/dL (ref 65–99)
Globulin, Total: 2.3 g/dL (ref 1.5–4.5)
Potassium: 4.1 mmol/L (ref 3.5–5.2)
Sodium: 142 mmol/L (ref 134–144)
TOTAL PROTEIN: 6.8 g/dL (ref 6.0–8.5)

## 2018-03-22 LAB — LIPID PANEL
CHOLESTEROL TOTAL: 155 mg/dL (ref 100–199)
Chol/HDL Ratio: 2.5 ratio (ref 0.0–4.4)
HDL: 61 mg/dL (ref >39)
LDL CALC: 81 mg/dL (ref 0–99)
Triglycerides: 64 mg/dL (ref 0–149)
VLDL Cholesterol Cal: 13 mg/dL (ref 5–40)

## 2018-03-22 LAB — CYTOLOGY - PAP
Diagnosis: NEGATIVE
HPV: NOT DETECTED

## 2018-03-22 LAB — CA 125: Cancer Antigen (CA) 125: 18.6 U/mL (ref 0.0–38.1)

## 2018-03-22 LAB — VITAMIN D 25 HYDROXY (VIT D DEFICIENCY, FRACTURES): Vit D, 25-Hydroxy: 28.8 ng/mL — ABNORMAL LOW (ref 30.0–100.0)

## 2018-03-25 NOTE — Addendum Note (Signed)
Addended by: Ardeth Perfect B on: 75/30/1040 09:24 AM   Modules accepted: Orders

## 2018-04-23 DIAGNOSIS — Z760 Encounter for issue of repeat prescription: Secondary | ICD-10-CM | POA: Diagnosis not present

## 2018-04-24 ENCOUNTER — Encounter: Payer: Self-pay | Admitting: Physician Assistant

## 2018-04-24 ENCOUNTER — Ambulatory Visit: Payer: Self-pay | Admitting: Physician Assistant

## 2018-04-24 VITALS — BP 120/80 | HR 110 | Temp 100.0°F | Resp 16 | Ht 63.0 in | Wt 189.0 lb

## 2018-04-24 DIAGNOSIS — R509 Fever, unspecified: Secondary | ICD-10-CM

## 2018-04-24 DIAGNOSIS — J101 Influenza due to other identified influenza virus with other respiratory manifestations: Secondary | ICD-10-CM

## 2018-04-24 LAB — POCT INFLUENZA A/B
INFLUENZA A, POC: POSITIVE — AB
Influenza B, POC: NEGATIVE

## 2018-04-24 MED ORDER — OSELTAMIVIR PHOSPHATE 75 MG PO CAPS: 75.0000 mg | ORAL_CAPSULE | Freq: Two times a day (BID) | ORAL | 0 refills | Status: AC

## 2018-04-24 NOTE — Progress Notes (Signed)
Patient ID: Shannon George DOB: 07-Aug-1980 AGE: 37 y.o. MRN: [id number]   PCP: Chad Cordial, PA-C   Chief Complaint:  Chief Complaint  Patient presents with  . Cough     x 2d  . Fever    last nite (100.5)  . Nasal Congestion    x 1d     Subjective:    HPI:  Shannon George is a 37 y.o. female presents for evaluation  Chief Complaint  Patient presents with  . Cough     x 2d  . Fever    last nite (100.5)  . Nasal Congestion    x 34d    37 year old female presents to [facility] with three day history of symptoms. Began Monday 04/22/2018 at approximately 9:30am with dry cough. Mild. Annoying. Lasted all day. Patient went to bed that night feeling ok. Following morning, Tuesday, woke up with nasal congestion, sore throat, ear pain (bilateral, pressure/fullness), chest congestion, and junky/coarse cough. By Tuesday evening, developed fever (100.51F at home), chills, body aches (primarily thighs bilaterally), and fatigue. Took OTC ibuprofen, Delsym cough syrup, and Zyrtec with minimal relief. Has not had an antipyretic today. Denies headache, dizziness/lightheadedness, change in vision, ear discharge/drainage, sinus pain, neck pain, chest pain, SOB, wheezing, nausea/vomiting, diarrhea, abdominal pain, rash.  Patient works in [employer]. Multiple sick contacts. Known patient with influenza last week. Patient did receive this season's influenza vaccination. Patient states symptoms feel similar to history of flu when she was in 12th grade. No smoking history. No asthma, COPD, or emphysema diagnosis.  A limited review of symptoms was performed, pertinent positives and negatives as mentioned in HPI.  The following portions of the patient's history were reviewed and updated as appropriate: allergies, current medications and past medical history.  Patient Active Problem List   Diagnosis Date Noted  . Abdominal pain, right upper quadrant 07/18/2014  . BRCA gene positive  07/18/2014  . BRCA1 gene mutation positive 05/08/2012    Allergies  Allergen Reactions  . Amoxicillin Rash  . Penicillins Rash    Current Outpatient Medications on File Prior to Visit  Medication Sig Dispense Refill  . dicyclomine (BENTYL) 20 MG tablet Take 1 tablet (20 mg total) by mouth 4 (four) times daily as needed for spasms. Take 1 tabled four times a day for 2 weeks. 120 tablet 1  . fluticasone (FLONASE) 50 MCG/ACT nasal spray Place 1 spray into both nostrils 2 (two) times daily as needed for allergies or rhinitis. 16 g 2  . ibuprofen (ADVIL,MOTRIN) 800 MG tablet Take 1 tablet (800 mg total) every 8 (eight) hours as needed by mouth. 30 tablet 1  . LORazepam (ATIVAN) 0.5 MG tablet Take 1 tablet (0.5 mg total) every 8 (eight) hours as needed by mouth for anxiety. 30 tablet 0  . Norethindrone-Ethinyl Estradiol-Fe Biphas (LO LOESTRIN FE) 1 MG-10 MCG / 10 MCG tablet Take 1 tablet by mouth daily. PT GETS SAMPLES 3 Package 3  . SUMAtriptan (IMITREX) 50 MG tablet Take 50 mg by mouth every 2 (two) hours as needed for migraine. May repeat in 2 hours if headache persists or recurs.     No current facility-administered medications on file prior to visit.        Objective:   Vitals:   04/24/18 1106  BP: 120/80  Pulse: (!) 110  Temp: 100 F (37.8 C)  SpO2: 99%     Wt Readings from Last 3 Encounters:  04/24/18 189 lb (85.7 kg)  03/21/18 184  lb (83.5 kg)  04/23/17 183 lb (83 kg)    Physical Exam:   General Appearance:  Patient sitting comfortably on examination table. Dressed in sweatpants. Conversational. Kermit Balo self-historian. In no acute distress. Patient appears mildly ill. 100F temperature.  Head:  Normocephalic, without obvious abnormality, atraumatic  Eyes:  PERRL, conjunctiva/corneas clear, EOM's intact  Ears:  Bilateral ear canals WNL. No erythema or edema. No discharge/drainage. Bilateral TMs WNL. No erythema, injection, or serous effusion. No scar tissue.  Nose: Nares  normal, septum midline. Nasal mucosa with minimal bilateral edema and thick clear rhinorrhea; nasally sounding voice. No sinus tenderness with percussion/palpation.  Throat: Lips, mucosa, and tongue normal; teeth and gums normal. Throat reveals no erythema. Tonsils with no enlargement or exudate.  Neck: Supple, symmetrical, trachea midline, no adenopathy  Lungs:   Clear to auscultation bilaterally, respirations unlabored. Good aeration. No wheezing, rales, rhonchi, or crackles. No cough elicited with deep inspiration.  Heart:  Mild tachycardia, 110bpm. Regular rhythm (sinus tachycardia). No audible murmur.  Extremities: Extremities normal, atraumatic, no cyanosis or edema  Pulses: 2+ and symmetric  Skin: Skin color, texture, turgor normal, no rashes or lesions  Lymph nodes: Cervical, supraclavicular, and axillary nodes normal  Neurologic: Normal    Assessment & Plan:    Exam findings, diagnosis etiology and medication use and indications reviewed with patient. Follow-Up and discharge instructions provided. No emergent/urgent issues found on exam.  Patient education was provided.   Patient verbalized understanding of information provided and agrees with plan of care (POC), all questions answered. The patient is advised to call or return to clinic if condition does not see an improvement in symptoms, or to seek the care of the closest emergency department if condition worsens with the below plan.    1. Influenza A  - oseltamivir (TAMIFLU) 75 MG capsule; Take 1 capsule (75 mg total) by mouth 2 (two) times daily for 5 days.  Dispense: 10 capsule; Refill: 0  2. Fever, unspecified fever cause  - POCT Influenza A/B  Patient with 48 hours of flu-like symptoms (though fever did not start until yesterday evening). Reports fever, chills, body aches, nasal congestion, sore throat, and cough. Benign physical examination other than 100F temperature and 110 pulse. Positive rapid flu test, performed in  clinic today, for influenza strain A. Prescribed patient Tamiflu 3m bid x 5 days. Advised increased fluids, rest, hygiene methods to decrease contagiousness, and OTC cough & cold medications. Gave patient work excuse. Advised f/u with [facility], PCP, or urgent care in 4-5 days if not feeling better, sooner with any worsening symptoms. Patient agrees with plan.   SDarlin Priestly MHS, PA-C SMontey Hora MHS, PA-C Advanced Practice Provider [facility] [address] [facility] [address] [place] [id number](p):  [phone]

## 2018-04-24 NOTE — Patient Instructions (Signed)
Thank you for choosing [facility] for your health care needs.  You have been diagnosed with influenza.  You had a positive rapid flu test, performed in the clinic today.  1. Influenza A  - oseltamivir (TAMIFLU) 75 MG capsule; Take 1 capsule (75 mg total) by mouth 2 (two) times daily for 5 days.  Dispense: 10 capsule; Refill: 0  Take Tamiflu as prescribed. Will shorten flu course. Will decrease viral shedding; making you less contagious.  Increase fluids; water, Gatorade, orange juice, or hot tea with lemon/honey. Rest. Use over the counter ibuprofen (Motrin or Advil) regularly for fever and body aches. Take 600mg  (three 200mg  tablets) every 6-8 hours. Take with food to prevent stomach upset.  May continue over the counter cough & cold medications. Zyrtec for runny nose. Sudafed for congestion. Delsym for cough.  You are contagious. Given work excuse note. Do not go back until you have been fever free for 24 hours and are feeling better. Wash your hands regularly. Use hand sanitizer. Cough in to elbow.  Hope you feel better soon.  Return to [facility] or follow-up at urgent care or with family physician in 4-5 days if not feeling better, sooner with any worsening symptoms.  Influenza, Adult Influenza is also called "the flu." It is an infection in the lungs, nose, and throat (respiratory tract). It is caused by a virus. The flu causes symptoms that are similar to symptoms of a cold. It also causes a high fever and body aches. The flu spreads easily from person to person (is contagious). Getting a flu shot (influenza vaccination) every year is the best way to prevent the flu. What are the causes? This condition is caused by the influenza virus. You can get the virus by:  Breathing in droplets that are in the air from the cough or sneeze of a person who has the virus.  Touching something that has the virus on it (is contaminated) and then touching your mouth, nose, or eyes. What increases  the risk? Certain things may make you more likely to get the flu. These include:  Not washing your hands often.  Having close contact with many people during cold and flu season.  Touching your mouth, eyes, or nose without first washing your hands.  Not getting a flu shot every year. You may have a higher risk for the flu, along with serious problems such as a lung infection (pneumonia), if you:  Are older than 65.  Are pregnant.  Have a weakened disease-fighting system (immune system) because of a disease or taking certain medicines.  Have a long-term (chronic) illness, such as: ? Heart, kidney, or lung disease. ? Diabetes. ? Asthma.  Have a liver disorder.  Are very overweight (morbidly obese).  Have anemia. This is a condition that affects your red blood cells. What are the signs or symptoms? Symptoms usually begin suddenly and last 4-14 days. They may include:  Fever and chills.  Headaches, body aches, or muscle aches.  Sore throat.  Cough.  Runny or stuffy (congested) nose.  Chest discomfort.  Not wanting to eat as much as normal (poor appetite).  Weakness or feeling tired (fatigue).  Dizziness.  Feeling sick to your stomach (nauseous) or throwing up (vomiting). How is this treated? If the flu is found early, you can be treated with medicine that can help reduce how bad the illness is and how long it lasts (antiviral medicine). This may be given by mouth (orally) or through an IV tube. Taking  care of yourself at home can help your symptoms get better. Your doctor may suggest:  Taking over-the-counter medicines.  Drinking plenty of fluids. The flu often goes away on its own. If you have very bad symptoms or other problems, you may be treated in a hospital. Follow these instructions at home:     Activity  Rest as needed. Get plenty of sleep.  Stay home from work or school as told by your doctor. ? Do not leave home until you do not have a fever  for 24 hours without taking medicine. ? Leave home only to visit your doctor. Eating and drinking  Take an ORS (oral rehydration solution). This is a drink that is sold at pharmacies and stores.  Drink enough fluid to keep your pee (urine) pale yellow.  Drink clear fluids in small amounts as you are able. Clear fluids include: ? Water. ? Ice chips. ? Fruit juice that has water added (diluted fruit juice). ? Low-calorie sports drinks.  Eat bland, easy-to-digest foods in small amounts as you are able. These foods include: ? Bananas. ? Applesauce. ? Rice. ? Lean meats. ? Toast. ? Crackers.  Do not eat or drink: ? Fluids that have a lot of sugar or caffeine. ? Alcohol. ? Spicy or fatty foods. General instructions  Take over-the-counter and prescription medicines only as told by your doctor.  Use a cool mist humidifier to add moisture to the air in your home. This can make it easier for you to breathe.  Cover your mouth and nose when you cough or sneeze.  Wash your hands with soap and water often, especially after you cough or sneeze. If you cannot use soap and water, use alcohol-based hand sanitizer.  Keep all follow-up visits as told by your doctor. This is important. How is this prevented?   Get a flu shot every year. You may get the flu shot in late summer, fall, or winter. Ask your doctor when you should get your flu shot.  Avoid contact with people who are sick during fall and winter (cold and flu season). Contact a doctor if:  You get new symptoms.  You have: ? Chest pain. ? Watery poop (diarrhea). ? A fever.  Your cough gets worse.  You start to have more mucus.  You feel sick to your stomach.  You throw up. Get help right away if you:  Have shortness of breath.  Have trouble breathing.  Have skin or nails that turn a bluish color.  Have very bad pain or stiffness in your neck.  Get a sudden headache.  Get sudden pain in your face or  ear.  Cannot eat or drink without throwing up. Summary  Influenza ("the flu") is an infection in the lungs, nose, and throat. It is caused by a virus.  Take over-the-counter and prescription medicines only as told by your doctor.  Getting a flu shot every year is the best way to avoid getting the flu. This information is not intended to replace advice given to you by your health care provider. Make sure you discuss any questions you have with your health care provider. Document Released: 02/01/2008 Document Revised: 10/10/2017 Document Reviewed: 10/10/2017 Elsevier Interactive Patient Education  2019 [employer].

## 2018-04-26 ENCOUNTER — Telehealth: Payer: Self-pay | Admitting: Emergency Medicine

## 2018-04-26 NOTE — Telephone Encounter (Signed)
Left message follow up call from visit with [facility]. 

## 2018-04-29 ENCOUNTER — Ambulatory Visit (INDEPENDENT_AMBULATORY_CARE_PROVIDER_SITE_OTHER): Payer: 59

## 2018-04-29 DIAGNOSIS — Z1501 Genetic susceptibility to malignant neoplasm of breast: Secondary | ICD-10-CM | POA: Diagnosis not present

## 2018-04-29 DIAGNOSIS — Z1509 Genetic susceptibility to other malignant neoplasm: Secondary | ICD-10-CM | POA: Diagnosis not present

## 2018-05-14 ENCOUNTER — Other Ambulatory Visit: Payer: Self-pay | Admitting: Obstetrics and Gynecology

## 2018-05-14 MED ORDER — HYDROCOD POLST-CPM POLST ER 10-8 MG/5ML PO SUER: 5.0000 mL | Freq: Two times a day (BID) | ORAL | 0 refills | Status: DC | PRN

## 2018-05-14 NOTE — Progress Notes (Signed)
Pt with lingering prod cough after flu. Lungs CTAB. Has nasal congestion/PND. Taking zyrtec D and delsym without relief. Try Rx tussionex at night to sleep, stop zyrtec D and just do sudafed Q4-6 hrs for nasal congestion, delsym for daytime cough. F/u prn.

## 2018-05-23 ENCOUNTER — Other Ambulatory Visit: Payer: Self-pay | Admitting: Obstetrics and Gynecology

## 2018-05-23 MED ORDER — CEFDINIR 300 MG PO CAPS: 300.0000 mg | ORAL_CAPSULE | Freq: Two times a day (BID) | ORAL | 0 refills | Status: AC

## 2018-05-23 NOTE — Progress Notes (Signed)
Rx cefdinir for sinusitis.

## 2018-05-29 ENCOUNTER — Ambulatory Visit
Admission: RE | Admit: 2018-05-29 | Discharge: 2018-05-29 | Disposition: A | Discharge status: Home / Self Care | Payer: 59 | Source: Ambulatory Visit | Attending: Obstetrics and Gynecology | Admitting: Obstetrics and Gynecology

## 2018-05-29 ENCOUNTER — Encounter: Payer: Self-pay | Admitting: Obstetrics and Gynecology

## 2018-05-29 DIAGNOSIS — R928 Other abnormal and inconclusive findings on diagnostic imaging of breast: Secondary | ICD-10-CM

## 2018-05-29 DIAGNOSIS — Z1239 Encounter for other screening for malignant neoplasm of breast: Secondary | ICD-10-CM | POA: Diagnosis not present

## 2018-05-29 DIAGNOSIS — Z1509 Genetic susceptibility to other malignant neoplasm: Secondary | ICD-10-CM | POA: Diagnosis not present

## 2018-05-29 DIAGNOSIS — Z9189 Other specified personal risk factors, not elsewhere classified: Secondary | ICD-10-CM | POA: Insufficient documentation

## 2018-05-29 DIAGNOSIS — Z1501 Genetic susceptibility to malignant neoplasm of breast: Secondary | ICD-10-CM

## 2018-06-17 ENCOUNTER — Encounter: Payer: Self-pay | Admitting: Obstetrics and Gynecology

## 2018-06-17 DIAGNOSIS — R2242 Localized swelling, mass and lump, left lower limb: Secondary | ICD-10-CM

## 2018-06-17 NOTE — Progress Notes (Signed)
Pt with painful LE mass near LT knee for a couple wks, now getting larger. ~1.5 cm mass. Check u/s.

## 2018-06-20 ENCOUNTER — Ambulatory Visit
Admission: RE | Admit: 2018-06-20 | Discharge: 2018-06-20 | Disposition: A | Discharge status: Home / Self Care | Payer: 59 | Source: Ambulatory Visit | Attending: Obstetrics and Gynecology | Admitting: Obstetrics and Gynecology

## 2018-06-20 DIAGNOSIS — R2242 Localized swelling, mass and lump, left lower limb: Secondary | ICD-10-CM | POA: Diagnosis not present

## 2018-06-20 DIAGNOSIS — M79662 Pain in left lower leg: Secondary | ICD-10-CM | POA: Diagnosis not present

## 2018-06-24 ENCOUNTER — Telehealth: Payer: Self-pay | Admitting: Obstetrics and Gynecology

## 2018-06-24 DIAGNOSIS — R2242 Localized swelling, mass and lump, left lower limb: Secondary | ICD-10-CM

## 2018-06-24 NOTE — Telephone Encounter (Signed)
Pt aware of LE u/s. Discussed ref to gen surg for 2nd opinion vs f/u with PCP vs follow sx. Pt wants to watch and wait for now. Will send to gen surg if area persists/worsens. Pt denies any known trauma to area.   EXAM: ULTRASOUND left LOWER EXTREMITY LIMITED  TECHNIQUE: Ultrasound examination of the lower extremity soft tissues was performed in the area of clinical concern.  COMPARISON:  None.  FINDINGS: Two discrete hypoechoic lesions are present within the subcutaneous fat in the region of interest. One measures 4 x 5 x 5 mm. The other measures 3 x 3 x 4 mm. These correspond with the palpable areas.  Both lesions demonstrate posterior acoustic shadowing. There is no significant vascular flow to either lesion.  IMPRESSION: 1. Two focal subcutaneous hypoechoic lesions as described. Differential diagnosis includes small granulomas related to prior trauma or infection. Foreign body granuloma is also considered. Active infection is considered less likely. These areas would be amenable to image guided biopsy if not discretely palpable. There is no deep extension.   Electronically Signed   By: San Morelle M.D.   On: 06/21/2018 09:39

## 2018-08-08 ENCOUNTER — Other Ambulatory Visit: Payer: Self-pay | Admitting: Obstetrics and Gynecology

## 2018-08-08 DIAGNOSIS — J0101 Acute recurrent maxillary sinusitis: Secondary | ICD-10-CM

## 2018-08-08 MED ORDER — AZITHROMYCIN 250 MG PO TABS: ORAL_TABLET | ORAL | 1 refills | Status: DC

## 2018-08-26 NOTE — Addendum Note (Signed)
Addended by: Ardeth Perfect B on: 1/99/1444 04:43 PM   Modules accepted: Orders

## 2018-08-26 NOTE — Telephone Encounter (Signed)
Pt states LT lower ext mass getting larger. Feels irreg shaped now. Will refer to gen surg for further eval. U/S was not definitive.

## 2018-09-03 ENCOUNTER — Other Ambulatory Visit: Payer: Self-pay

## 2018-09-03 ENCOUNTER — Encounter: Payer: Self-pay | Admitting: General Surgery

## 2018-09-03 ENCOUNTER — Ambulatory Visit (INDEPENDENT_AMBULATORY_CARE_PROVIDER_SITE_OTHER): Payer: 59 | Admitting: General Surgery

## 2018-09-03 VITALS — BP 127/86 | HR 80 | Temp 97.5°F | Resp 15 | Ht 64.0 in | Wt 197.6 lb

## 2018-09-03 DIAGNOSIS — R2242 Localized swelling, mass and lump, left lower limb: Secondary | ICD-10-CM

## 2018-09-03 NOTE — Progress Notes (Signed)
Patient ID: Shannon George, female   DOB: [birth year], 38 y.o.   MRN: [id number]  Chief Complaint  Patient presents with  . Other    leg mass    HPI Shannon George is a 38 y.o. female here for evaluation of left leg mass. She noticed this area at the beginning of February and it was about the size of a lady bug and has since tripled in size. She had an ultrasound on 06/20/18. She reports discomfort with a lot of walking, standing or pressure to the area.  HPI  Past Medical History:  Diagnosis Date  . BRCA1 gene mutation positive 2014   BRCA 1 valine substitution error. [facility]  . Family history of breast cancer   . History of Papanicolaou smear of cervix 04/24/13; 06/04/14   -/+; neg  . Migraine   . Pap smear abnormality of cervix with ASCUS favoring dysplasia   . Vitamin D deficiency     Past Surgical History:  Procedure Laterality Date  . BREAST BIOPSY Left 06/20/2017   FIBROCYSTIC CHANGE of MRI bx, dumbell marker  . CERVICAL BIOPSY  W/ LOOP ELECTRODE EXCISION  2001   [place]  . CESAREAN SECTION  2006, 2008  . [place] ABLATION  2013   [facility]  . TUBAL LIGATION  2008    Family History  Problem Relation Age of Onset  . Cancer Father 67       lung  . Hypertension Mother   . Breast cancer Mother        15 (medullary carcinoma, poorly differentiated) and 60  . Breast cancer Paternal Aunt 23  . Breast cancer Maternal Grandmother 33  . Cancer Paternal Grandmother        BLADDER; KIDNEY  . Cancer Maternal Grandfather        STOMACH    Social History Social History   Tobacco Use  . Smoking status: Never Smoker  . Smokeless tobacco: Never Used  Substance Use Topics  . Alcohol use: No    Alcohol/week: 0.0 standard drinks  . Drug use: No    Allergies  Allergen Reactions  . Amoxicillin Rash  . Penicillins Rash    Current Outpatient Medications  Medication Sig Dispense Refill  . fluticasone (FLONASE) 50 MCG/ACT nasal spray Place 1 spray into both nostrils 2 (two)  times daily as needed for allergies or rhinitis. 16 g 2  . ibuprofen (ADVIL,MOTRIN) 800 MG tablet Take 1 tablet (800 mg total) every 8 (eight) hours as needed by mouth. 30 tablet 1  . LORazepam (ATIVAN) 0.5 MG tablet Take 1 tablet (0.5 mg total) every 8 (eight) hours as needed by mouth for anxiety. 30 tablet 0  . Norethindrone-Ethinyl Estradiol-Fe Biphas (LO LOESTRIN FE) 1 MG-10 MCG / 10 MCG tablet Take 1 tablet by mouth daily. PT GETS SAMPLES 3 Package 3  . SUMAtriptan (IMITREX) 50 MG tablet Take 50 mg by mouth every 2 (two) hours as needed for migraine. May repeat in 2 hours if headache persists or recurs.     No current facility-administered medications for this visit.     Review of Systems Review of Systems  Blood pressure 127/86, pulse 80, temperature (!) 97.5 F (36.4 C), resp. rate 15, height 5' 4" (1.626 m), weight 197 lb 9.6 oz (89.6 kg), SpO2 97 %.  Physical Exam Physical Exam Constitutional:      Appearance: She is well-developed.  Eyes:     General: No scleral icterus.    Conjunctiva/sclera: Conjunctivae normal.  Neck:  Musculoskeletal: Neck supple.  Cardiovascular:     Rate and Rhythm: Normal rate and regular rhythm.     Heart sounds: Normal heart sounds.  Pulmonary:     Effort: Pulmonary effort is normal.     Breath sounds: Normal breath sounds.  Musculoskeletal:       Legs:  Lymphadenopathy:     Cervical: No cervical adenopathy.  Skin:    General: Skin is warm and dry.     Comments: 1.2 cm mass in left proximal calf   Neurological:     Mental Status: She is alert and oriented to person, place, and time.     Data Reviewed Lower extremity ultrasound of June 20, 2018 was independently reviewed.  Focal subcutaneous nodules 4-5 mm in diameter were reported.  Assessment Possible venous varix versus multilobulated lipoma.  Plan  The patient reports the area was originally about 3-4 mm in diameter.  Significant increase in size and local tenderness  warrants excision.  This can be safely done as an office procedure on a young healthy individual in light of the ongoing  [place] pandemic.     HPI, Physical Exam, Assessment and Plan have been scribed under the direction and in the presence of Robert Bellow, MD  Concepcion Living, LPN  I have completed the exam and reviewed the above documentation for accuracy and completeness.  I agree with the above.  Haematologist has been used and any errors in dictation or transcription are unintentional.  Hervey Ard, M.D., F.A.C.S.  Forest Gleason Byrnett 09/05/2018, 3:32 PM

## 2018-09-03 NOTE — Patient Instructions (Signed)
We will schedule you for excision of the mass in office.

## 2018-09-05 ENCOUNTER — Encounter: Payer: Self-pay | Admitting: General Surgery

## 2018-09-24 ENCOUNTER — Ambulatory Visit (INDEPENDENT_AMBULATORY_CARE_PROVIDER_SITE_OTHER): Payer: 59 | Admitting: General Surgery

## 2018-09-24 ENCOUNTER — Other Ambulatory Visit: Payer: Self-pay

## 2018-09-24 ENCOUNTER — Encounter: Payer: Self-pay | Admitting: General Surgery

## 2018-09-24 VITALS — BP 120/82 | HR 78 | Temp 98.1°F | Resp 16 | Ht 64.0 in | Wt 195.0 lb

## 2018-09-24 DIAGNOSIS — R2242 Localized swelling, mass and lump, left lower limb: Secondary | ICD-10-CM

## 2018-09-24 DIAGNOSIS — M7989 Other specified soft tissue disorders: Secondary | ICD-10-CM | POA: Diagnosis not present

## 2018-09-24 NOTE — Patient Instructions (Addendum)
We will call you with your pathology results May shower May use ice pack as needed for comfort May remove dressing in 2-3 days. Steri strip will gradually come off in 2-3 weeks.

## 2018-09-24 NOTE — Progress Notes (Signed)
Patient ID: Shannon George, female   DOB: [birth year], 38 y.o.   MRN: [id number]  Chief Complaint  Patient presents with  . Procedure    HPI Shannon George is a 38 y.o. female.  Here for excision of left leg mass. She is here with her husband, Shannon George.   HPI  Past Medical History:  Diagnosis Date  . BRCA1 gene mutation positive 2014   BRCA 1 valine substitution error. [facility]  . Family history of breast cancer   . History of Papanicolaou smear of cervix 04/24/13; 06/04/14   -/+; neg  . Migraine   . Pap smear abnormality of cervix with ASCUS favoring dysplasia   . Vitamin D deficiency     Past Surgical History:  Procedure Laterality Date  . BREAST BIOPSY Left 06/20/2017   FIBROCYSTIC CHANGE of MRI bx, dumbell marker  . CERVICAL BIOPSY  W/ LOOP ELECTRODE EXCISION  2001   [place]  . CESAREAN SECTION  2006, 2008  . [place] ABLATION  2013   [facility]  . TUBAL LIGATION  2008    Family History  Problem Relation Age of Onset  . Cancer Father 45       lung  . Hypertension Mother   . Breast cancer Mother        43 (medullary carcinoma, poorly differentiated) and 36  . Breast cancer Paternal Aunt 22  . Breast cancer Maternal Grandmother 51  . Cancer Paternal Grandmother        BLADDER; KIDNEY  . Cancer Maternal Grandfather        STOMACH    Social History Social History   Tobacco Use  . Smoking status: Never Smoker  . Smokeless tobacco: Never Used  Substance Use Topics  . Alcohol use: No    Alcohol/week: 0.0 standard drinks  . Drug use: No    Allergies  Allergen Reactions  . Amoxicillin Rash  . Penicillins Rash    Current Outpatient Medications  Medication Sig Dispense Refill  . fluticasone (FLONASE) 50 MCG/ACT nasal spray Place 1 spray into both nostrils 2 (two) times daily as needed for allergies or rhinitis. 16 g 2  . ibuprofen (ADVIL,MOTRIN) 800 MG tablet Take 1 tablet (800 mg total) every 8 (eight) hours as needed by mouth. 30 tablet 1  . LORazepam (ATIVAN)  0.5 MG tablet Take 1 tablet (0.5 mg total) every 8 (eight) hours as needed by mouth for anxiety. 30 tablet 0  . Norethindrone-Ethinyl Estradiol-Fe Biphas (LO LOESTRIN FE) 1 MG-10 MCG / 10 MCG tablet Take 1 tablet by mouth daily. PT GETS SAMPLES 3 Package 3  . SUMAtriptan (IMITREX) 50 MG tablet Take 50 mg by mouth every 2 (two) hours as needed for migraine. May repeat in 2 hours if headache persists or recurs.     No current facility-administered medications for this visit.     Review of Systems Review of Systems  Constitutional: Negative.   Respiratory: Negative.   Cardiovascular: Negative.     Blood pressure 120/82, pulse 78, temperature 98.1 F (36.7 C), temperature source Temporal, resp. rate 16, height _0  (1.626 m), weight 195 lb (88.5 kg), SpO2 95 %.  Physical Exam Physical Exam Musculoskeletal:       Legs:     Data Reviewed The procedure was reviewed and the patient was amenable to proceed.  ChloraPrep was applied to the skin followed by 10 cc of 0.5% Xylocaine with 0.25% Marcaine with 1-200,000's of epinephrine.  After suitable waiting.  The area was recleansed  with ChloraPrep and draped.  A vertical incision was made over the mass.  The skin was incised sharply.  Hemostasis was with 3-0 Vicryl ties.  A multilobulated adipose mass with suggestion of traumatic fat was removed and sent in formalin for routine histology.  The deep tissue was approximated with interrupted 3-0 Vicryl figure-of-eight sutures.  The skin was closed with a running 4-0 Vicryl subcuticular suture.  Benzoin, Steri-Strip, Telfa and Tegaderm dressing applied.  Ice pack provided.  Assessment Subcutaneous lipoma with clinical evidence of traumatic soft tissue injury.  Plan Wound care was reviewed.  The patient will call if there is any concern regarding wound healing.  Follow-up otherwise will be on an as-needed basis.   HPI, Physical Exam, Assessment and Plan have been scribed under the direction and in  the presence of Robert Bellow, MD  Shannon Living, LPN  HPI, assessment, plan and physical exam has been scribed under the direction and in the presence of Robert Bellow, MD. Shannon Fetch, RN  I have completed the exam and reviewed the above documentation for accuracy and completeness.  I agree with the above.  Haematologist has been used and any errors in dictation or transcription are unintentional.  Hervey Ard, M.D., F.A.C.S.  Shannon George Shannon George 09/25/2018, 2:03 PM

## 2018-09-27 ENCOUNTER — Telehealth: Payer: Self-pay

## 2018-09-27 NOTE — Telephone Encounter (Signed)
Notified patient as instructed, patient pleased. Discussed follow-up appointments, patient agrees  

## 2018-09-27 NOTE — Telephone Encounter (Signed)
-----   Message from Robert Bellow, MD sent at 09/27/2018  9:50 AM EDT ----- Notify path OK. Traumatized fat.  ----- Message ----- From: Interface, Lab In [facility] Sent: 09/27/2018   8:39 AM EDT To: Robert Bellow, MD

## 2018-10-16 ENCOUNTER — Other Ambulatory Visit: Payer: Self-pay | Admitting: Obstetrics and Gynecology

## 2018-10-16 DIAGNOSIS — Z1501 Genetic susceptibility to malignant neoplasm of breast: Secondary | ICD-10-CM

## 2018-10-16 DIAGNOSIS — Z1509 Genetic susceptibility to other malignant neoplasm: Secondary | ICD-10-CM

## 2018-10-16 NOTE — Progress Notes (Signed)
Pt due for Q6 mo GYN u/s due to BRCA pos. 

## 2018-10-18 ENCOUNTER — Ambulatory Visit (INDEPENDENT_AMBULATORY_CARE_PROVIDER_SITE_OTHER): Payer: 59

## 2018-10-18 ENCOUNTER — Other Ambulatory Visit: Payer: Self-pay

## 2018-10-18 DIAGNOSIS — Z1501 Genetic susceptibility to malignant neoplasm of breast: Secondary | ICD-10-CM | POA: Diagnosis not present

## 2018-10-18 DIAGNOSIS — N83201 Unspecified ovarian cyst, right side: Secondary | ICD-10-CM

## 2018-10-18 DIAGNOSIS — Z1509 Genetic susceptibility to other malignant neoplasm: Secondary | ICD-10-CM | POA: Diagnosis not present

## 2018-10-28 ENCOUNTER — Other Ambulatory Visit: Payer: 59

## 2018-10-31 ENCOUNTER — Other Ambulatory Visit: Payer: 59

## 2018-10-31 DIAGNOSIS — H5213 Myopia, bilateral: Secondary | ICD-10-CM | POA: Diagnosis not present

## 2018-10-31 DIAGNOSIS — Z135 Encounter for screening for eye and ear disorders: Secondary | ICD-10-CM | POA: Diagnosis not present

## 2018-11-04 ENCOUNTER — Other Ambulatory Visit: Payer: Self-pay | Admitting: Obstetrics and Gynecology

## 2018-11-04 DIAGNOSIS — L2389 Allergic contact dermatitis due to other agents: Secondary | ICD-10-CM

## 2018-11-04 MED ORDER — HYDROCORTISONE 2.5 % EX OINT: TOPICAL_OINTMENT | Freq: Two times a day (BID) | CUTANEOUS | 0 refills | Status: DC

## 2018-11-11 ENCOUNTER — Other Ambulatory Visit: Payer: Self-pay | Admitting: Obstetrics & Gynecology

## 2018-11-11 MED ORDER — PREDNISONE 10 MG (21) PO TBPK: ORAL_TABLET | ORAL | 0 refills | Status: DC

## 2018-12-12 ENCOUNTER — Other Ambulatory Visit: Payer: Self-pay | Admitting: Obstetrics & Gynecology

## 2018-12-12 MED ORDER — PHENTERMINE HCL 37.5 MG PO TABS: ORAL_TABLET | ORAL | 0 refills | Status: DC

## 2018-12-12 NOTE — Progress Notes (Signed)
Discussed weight concerns, difficulty to lose any more w diet and exercise practices in place Wt today 180  Ht 5-4  BMI 31 Phentermine pros and cons discussed F/u discussed  Barnett Applebaum, MD, [facility] 12/12/2018  1:27 PM

## 2018-12-31 ENCOUNTER — Other Ambulatory Visit: Payer: Self-pay | Admitting: Obstetrics and Gynecology

## 2018-12-31 MED ORDER — SUMATRIPTAN SUCCINATE 100 MG PO TABS: 100.0000 mg | ORAL_TABLET | ORAL | 5 refills | Status: AC | PRN

## 2019-01-16 ENCOUNTER — Other Ambulatory Visit: Payer: Self-pay

## 2019-01-16 ENCOUNTER — Ambulatory Visit: Payer: 59

## 2019-01-16 ENCOUNTER — Telehealth (INDEPENDENT_AMBULATORY_CARE_PROVIDER_SITE_OTHER): Payer: 59 | Admitting: Obstetrics and Gynecology

## 2019-01-16 DIAGNOSIS — R102 Pelvic and perineal pain: Secondary | ICD-10-CM

## 2019-01-16 LAB — POCT URINALYSIS DIPSTICK
Protein, UA: POSITIVE — AB
Spec Grav, UA: 1.015 (ref 1.010–1.025)
pH, UA: 6.5 (ref 5.0–8.0)

## 2019-01-16 MED ORDER — NITROFURANTOIN MONOHYD MACRO 100 MG PO CAPS: 100.0000 mg | ORAL_CAPSULE | Freq: Two times a day (BID) | ORAL | 0 refills | Status: AC

## 2019-01-16 NOTE — Telephone Encounter (Signed)
Pt with pelvic pressure and discomfort since yesterday. No constipation. No other UTI sx. Rx macrobid for UTI sx.  Results for orders placed or performed in visit on 01/16/19 (from the past 24 hour(s))  POCT Urinalysis Dipstick     Status: Abnormal   Collection Time: 01/16/19  9:02 AM  Result Value Ref Range   Color, UA     Clarity, UA     Glucose, UA     Bilirubin, UA     Ketones, UA     Spec Grav, UA 1.015 1.010 - 1.025   Blood, UA large    pH, UA 6.5 5.0 - 8.0   Protein, UA Positive (A) Negative   Urobilinogen, UA     Nitrite, UA     Leukocytes, UA Moderate (2+) (A) Negative   Appearance     Odor

## 2019-01-18 LAB — URINE CULTURE

## 2019-02-05 DIAGNOSIS — R635 Abnormal weight gain: Secondary | ICD-10-CM | POA: Diagnosis not present

## 2019-02-05 DIAGNOSIS — E559 Vitamin D deficiency, unspecified: Secondary | ICD-10-CM | POA: Diagnosis not present

## 2019-02-05 DIAGNOSIS — N951 Menopausal and female climacteric states: Secondary | ICD-10-CM | POA: Diagnosis not present

## 2019-02-05 DIAGNOSIS — R5383 Other fatigue: Secondary | ICD-10-CM | POA: Diagnosis not present

## 2019-02-05 DIAGNOSIS — Z6831 Body mass index (BMI) 31.0-31.9, adult: Secondary | ICD-10-CM | POA: Diagnosis not present

## 2019-02-11 DIAGNOSIS — R5383 Other fatigue: Secondary | ICD-10-CM | POA: Diagnosis not present

## 2019-02-11 DIAGNOSIS — Z6832 Body mass index (BMI) 32.0-32.9, adult: Secondary | ICD-10-CM | POA: Diagnosis not present

## 2019-02-11 DIAGNOSIS — E559 Vitamin D deficiency, unspecified: Secondary | ICD-10-CM | POA: Diagnosis not present

## 2019-02-11 DIAGNOSIS — N951 Menopausal and female climacteric states: Secondary | ICD-10-CM | POA: Diagnosis not present

## 2019-02-11 DIAGNOSIS — Z1339 Encounter for screening examination for other mental health and behavioral disorders: Secondary | ICD-10-CM | POA: Diagnosis not present

## 2019-02-11 DIAGNOSIS — Z1331 Encounter for screening for depression: Secondary | ICD-10-CM | POA: Diagnosis not present

## 2019-02-12 ENCOUNTER — Other Ambulatory Visit: Payer: Self-pay | Admitting: Obstetrics and Gynecology

## 2019-02-12 DIAGNOSIS — Z1231 Encounter for screening mammogram for malignant neoplasm of breast: Secondary | ICD-10-CM

## 2019-02-18 DIAGNOSIS — E559 Vitamin D deficiency, unspecified: Secondary | ICD-10-CM | POA: Diagnosis not present

## 2019-02-18 DIAGNOSIS — Z6831 Body mass index (BMI) 31.0-31.9, adult: Secondary | ICD-10-CM | POA: Diagnosis not present

## 2019-02-20 ENCOUNTER — Other Ambulatory Visit: Payer: Self-pay | Admitting: General Surgery

## 2019-02-20 NOTE — Progress Notes (Unsigned)
me

## 2019-02-25 DIAGNOSIS — E559 Vitamin D deficiency, unspecified: Secondary | ICD-10-CM | POA: Diagnosis not present

## 2019-02-25 DIAGNOSIS — Z6831 Body mass index (BMI) 31.0-31.9, adult: Secondary | ICD-10-CM | POA: Diagnosis not present

## 2019-03-04 DIAGNOSIS — E559 Vitamin D deficiency, unspecified: Secondary | ICD-10-CM | POA: Diagnosis not present

## 2019-03-04 DIAGNOSIS — Z6831 Body mass index (BMI) 31.0-31.9, adult: Secondary | ICD-10-CM | POA: Diagnosis not present

## 2019-03-11 DIAGNOSIS — Z6830 Body mass index (BMI) 30.0-30.9, adult: Secondary | ICD-10-CM | POA: Diagnosis not present

## 2019-03-11 DIAGNOSIS — E559 Vitamin D deficiency, unspecified: Secondary | ICD-10-CM | POA: Diagnosis not present

## 2019-03-18 DIAGNOSIS — Z6830 Body mass index (BMI) 30.0-30.9, adult: Secondary | ICD-10-CM | POA: Diagnosis not present

## 2019-03-18 DIAGNOSIS — E559 Vitamin D deficiency, unspecified: Secondary | ICD-10-CM | POA: Diagnosis not present

## 2019-03-23 NOTE — Progress Notes (Signed)
PCP:  Chad Cordial, PA-C   Chief Complaint  Patient presents with  . Gynecologic Exam     HPI:      [name] is a 38 y.o. G2P2002 who LMP was No LMP recorded. Patient has had an ablation., presents today for her annual examination.  Her menses are absent due to endometrial ablation. She has occas light spotting with wiping. Is also on OCPs for BRCA 1 gene mutation.   Sex activity: single partner, contraception - tubal ligation.  Last Pap: 03/21/18  Results were: no abnormalities /neg HPV DNA  Hx of STDs: HPV on cx  Last mammogram: 05/29/18 Results were normal, repeat in 12 months. Has appt scheduled.  There is a FH of breast cancer in her mom, MGM, and pat aunt. There is no FH of ovarian cancer.   PT IS BRCA 1 POS. The patient does do self-breast exams. She is doing yearly mammo (has appt 1/21) and screening breast MRI (done 2/19), as well as CBE. She is taking Vit D supp 5000 IU daily due to deficiency in past, slightly low 10/19. She is taking OCPs for ovar cancer prevention and gets Q6 mo ca-125 (11/19) and GYN u/s (6/20). Due Q6 months. She has not been interested in BSO, prophylactic mastectomy, or tamoxifen in the past, but is aware of recommendations.  Would still be candidate for HRT.   Tobacco use: The patient denies current or previous tobacco use. Alcohol use: none No drug use.  Exercise: moderately active  She does get adequate calcium and Vitamin D in her diet.  Normal fasting labs 9/20. Doing wt loss/diet changes through [facility].  Past Medical History:  Diagnosis Date  . BRCA1 gene mutation positive 2014   BRCA 1 valine substitution error. [facility]  . Family history of breast cancer   . History of Papanicolaou smear of cervix 04/24/13; 06/04/14   -/+; neg  . Migraine   . Pap smear abnormality of cervix with ASCUS favoring dysplasia   . Vitamin D deficiency     Past Surgical History:  Procedure Laterality Date  . BREAST BIOPSY Left  06/20/2017   FIBROCYSTIC CHANGE of MRI bx, dumbell marker  . CERVICAL BIOPSY  W/ LOOP ELECTRODE EXCISION  2001   [place]  . CESAREAN SECTION  2006, 2008  . [place] ABLATION  2013   [facility]  . TUBAL LIGATION  2008    Family History  Problem Relation Age of Onset  . Cancer Father 42       lung  . Hypertension Mother   . Breast cancer Mother        90 (medullary carcinoma, poorly differentiated) and 76  . Breast cancer Paternal Aunt 80  . Breast cancer Maternal Grandmother 79  . Cancer Paternal Grandmother        BLADDER; KIDNEY  . Cancer Maternal Grandfather        STOMACH    Social History   Socioeconomic History  . Marital status: Married    Spouse name: [name]  . Number of children: 2  . Years of education: 61  . Highest education level: Not on file  Occupational History  . Occupation: MEDICAL RECORDS    Comment: [place]  . Financial resource strain: Not on file  . Food insecurity    Worry: Not on file    Inability: Not on file  . Transportation needs    Medical: Not on file    Non-medical:  Not on file  Tobacco Use  . Smoking status: Never Smoker  . Smokeless tobacco: Never Used  Substance and Sexual Activity  . Alcohol use: No    Alcohol/week: 0.0 standard drinks  . Drug use: No  . Sexual activity: Yes    Birth control/protection: None  Lifestyle  . Physical activity    Days per week: Not on file    Minutes per session: Not on file  . Stress: Not on file  Relationships  . Social Herbalist on phone: Not on file    Gets together: Not on file    Attends religious service: Not on file    Active member of club or organization: Not on file    Attends meetings of clubs or organizations: Not on file    Relationship status: Not on file  . Intimate partner violence    Fear of current or ex partner: Not on file    Emotionally abused: Not on file    Physically abused: Not on file    Forced sexual activity: Not on file  Other Topics  Concern  . Not on file  Social History Narrative  . Not on file    Current Meds  Medication Sig  . fluticasone (FLONASE) 50 MCG/ACT nasal spray Place 1 spray into both nostrils 2 (two) times daily as needed for allergies or rhinitis.  Marland Kitchen ibuprofen (ADVIL,MOTRIN) 800 MG tablet Take 1 tablet (800 mg total) every 8 (eight) hours as needed by mouth.  Marland Kitchen LORazepam (ATIVAN) 0.5 MG tablet Take 1 tablet (0.5 mg total) every 8 (eight) hours as needed by mouth for anxiety.  . Norethindrone-Ethinyl Estradiol-Fe Biphas (LO LOESTRIN FE) 1 MG-10 MCG / 10 MCG tablet Take 1 tablet by mouth daily. PT GETS SAMPLES  . SUMAtriptan (IMITREX) 100 MG tablet Take 1 tablet (100 mg total) by mouth every 2 (two) hours as needed for migraine. May repeat in 2 hours if headache persists or recurs.     ROS:  Review of Systems  Constitutional: Negative for fatigue, fever and unexpected weight change.  Respiratory: Negative for cough, shortness of breath and wheezing.   Cardiovascular: Negative for chest pain, palpitations and leg swelling.  Gastrointestinal: Negative for blood in stool, constipation, diarrhea, nausea and vomiting.  Endocrine: Negative for cold intolerance, heat intolerance and polyuria.  Genitourinary: Negative for dyspareunia, dysuria, flank pain, frequency, genital sores, hematuria, menstrual problem, pelvic pain, urgency, vaginal bleeding, vaginal discharge and vaginal pain.  Musculoskeletal: Negative for back pain, joint swelling and myalgias.  Skin: Negative for rash.  Neurological: Negative for dizziness, syncope, light-headedness, numbness and headaches.  Hematological: Negative for adenopathy.  Psychiatric/Behavioral: Negative for agitation, confusion, sleep disturbance and suicidal ideas. The patient is not nervous/anxious.      Objective: BP 100/70   Ht _0  (1.626 m)   Wt 180 lb (81.6 kg)   BMI 30.90 kg/m    Physical Exam Constitutional:      Appearance: She is well-developed.   Genitourinary:     Vulva, vagina, uterus, right adnexa and left adnexa normal.     No vulval lesion or tenderness noted.     No vaginal discharge, erythema or tenderness.     No cervical motion tenderness or polyp.     Uterus is not enlarged or tender.     No right or left adnexal mass present.     Right adnexa not tender.     Left adnexa not tender.  Neck:  Musculoskeletal: Normal range of motion.     Thyroid: No thyromegaly.  Cardiovascular:     Rate and Rhythm: Normal rate and regular rhythm.     Heart sounds: Normal heart sounds. No murmur.  Pulmonary:     Effort: Pulmonary effort is normal.     Breath sounds: Normal breath sounds.  Chest:     Breasts:        Right: No mass, nipple discharge, skin change or tenderness.        Left: No mass, nipple discharge, skin change or tenderness.  Abdominal:     Palpations: Abdomen is soft.     Tenderness: There is no abdominal tenderness. There is no guarding.  Musculoskeletal: Normal range of motion.  Neurological:     General: No focal deficit present.     Mental Status: She is alert and oriented to person, place, and time.     Cranial Nerves: No cranial nerve deficit.  Skin:    General: Skin is warm and dry.  Psychiatric:        Mood and Affect: Mood normal.        Behavior: Behavior normal.        Thought Content: Thought content normal.        Judgment: Judgment normal.  Vitals signs reviewed.     Assessment/Plan: Encounter for annual routine gynecological examination  Encounter for surveillance of contraceptive pills--pt on OCPs for BRCA. Gets samples Lo Loestrin.  Encounter for screening mammogram for malignant neoplasm of breast; pt has mammo sched. Will do breast MRI within 6 months of mammo.  BRCA gene positive - Plan: CA 125, US Transvaginal Non-OB; Pt doing monthly SBE, yearly CBE and mammos, as well as scr breast MRI. Doing Q6 mo ca-125 and GYN u/s. Aware of preventive options of prophylactic mastectomy, BSO,  and tamoxifen. Pt declines for now. Cont Vit D supp and OCPs.          GYN counsel breast self exam, mammography screening, use and side effects of OCP's, adequate intake of calcium and vitamin D, diet and exercise     F/U  Return in about 1 year (around 03/23/2020).  Alicia B. Copland, PA-C 03/24/2019 8:31 AM

## 2019-03-24 ENCOUNTER — Encounter: Payer: Self-pay | Admitting: Obstetrics and Gynecology

## 2019-03-24 ENCOUNTER — Ambulatory Visit (INDEPENDENT_AMBULATORY_CARE_PROVIDER_SITE_OTHER): Payer: 59 | Admitting: Obstetrics and Gynecology

## 2019-03-24 ENCOUNTER — Other Ambulatory Visit: Payer: Self-pay

## 2019-03-24 VITALS — BP 100/70 | Ht 64.0 in | Wt 180.0 lb

## 2019-03-24 DIAGNOSIS — Z3041 Encounter for surveillance of contraceptive pills: Secondary | ICD-10-CM

## 2019-03-24 DIAGNOSIS — Z1509 Genetic susceptibility to other malignant neoplasm: Secondary | ICD-10-CM | POA: Diagnosis not present

## 2019-03-24 DIAGNOSIS — Z7689 Persons encountering health services in other specified circumstances: Secondary | ICD-10-CM | POA: Diagnosis not present

## 2019-03-24 DIAGNOSIS — Z01419 Encounter for gynecological examination (general) (routine) without abnormal findings: Secondary | ICD-10-CM

## 2019-03-24 DIAGNOSIS — Z1501 Genetic susceptibility to malignant neoplasm of breast: Secondary | ICD-10-CM

## 2019-03-24 DIAGNOSIS — Z1231 Encounter for screening mammogram for malignant neoplasm of breast: Secondary | ICD-10-CM

## 2019-03-24 NOTE — Patient Instructions (Signed)
I value your feedback and entrusting us with your care. If you get a [facility] patient survey, I would appreciate you taking the time to let us know about your experience today. Thank you! 

## 2019-03-25 DIAGNOSIS — Z6830 Body mass index (BMI) 30.0-30.9, adult: Secondary | ICD-10-CM | POA: Diagnosis not present

## 2019-03-25 DIAGNOSIS — E559 Vitamin D deficiency, unspecified: Secondary | ICD-10-CM | POA: Diagnosis not present

## 2019-03-25 LAB — CA 125: Cancer Antigen (CA) 125: 36 U/mL (ref 0.0–38.1)

## 2019-04-01 DIAGNOSIS — E559 Vitamin D deficiency, unspecified: Secondary | ICD-10-CM | POA: Diagnosis not present

## 2019-04-01 DIAGNOSIS — Z6830 Body mass index (BMI) 30.0-30.9, adult: Secondary | ICD-10-CM | POA: Diagnosis not present

## 2019-04-08 DIAGNOSIS — E559 Vitamin D deficiency, unspecified: Secondary | ICD-10-CM | POA: Diagnosis not present

## 2019-04-08 DIAGNOSIS — Z6829 Body mass index (BMI) 29.0-29.9, adult: Secondary | ICD-10-CM | POA: Diagnosis not present

## 2019-04-14 ENCOUNTER — Other Ambulatory Visit: Payer: Self-pay

## 2019-04-14 ENCOUNTER — Ambulatory Visit: Payer: 59

## 2019-04-14 DIAGNOSIS — Z6829 Body mass index (BMI) 29.0-29.9, adult: Secondary | ICD-10-CM | POA: Diagnosis not present

## 2019-04-14 DIAGNOSIS — E559 Vitamin D deficiency, unspecified: Secondary | ICD-10-CM | POA: Diagnosis not present

## 2019-04-15 ENCOUNTER — Ambulatory Visit (INDEPENDENT_AMBULATORY_CARE_PROVIDER_SITE_OTHER): Payer: 59

## 2019-04-15 ENCOUNTER — Other Ambulatory Visit: Payer: Self-pay

## 2019-04-15 DIAGNOSIS — N8302 Follicular cyst of left ovary: Secondary | ICD-10-CM | POA: Diagnosis not present

## 2019-04-15 DIAGNOSIS — Z1501 Genetic susceptibility to malignant neoplasm of breast: Secondary | ICD-10-CM

## 2019-04-15 DIAGNOSIS — Z1509 Genetic susceptibility to other malignant neoplasm: Secondary | ICD-10-CM

## 2019-04-21 ENCOUNTER — Encounter
Admission: RE | Admit: 2019-04-21 | Discharge: 2019-04-21 | Disposition: A | Discharge status: Home / Self Care | Payer: 59 | Source: Ambulatory Visit | Attending: Obstetrics and Gynecology | Admitting: Obstetrics and Gynecology

## 2019-04-21 ENCOUNTER — Other Ambulatory Visit: Payer: Self-pay

## 2019-04-21 ENCOUNTER — Other Ambulatory Visit
Admission: RE | Admit: 2019-04-21 | Discharge: 2019-04-21 | Disposition: A | Discharge status: Home / Self Care | Payer: 59 | Source: Ambulatory Visit | Attending: Obstetrics and Gynecology | Admitting: Obstetrics and Gynecology

## 2019-04-21 ENCOUNTER — Other Ambulatory Visit: Payer: 59

## 2019-04-21 DIAGNOSIS — Z20828 Contact with and (suspected) exposure to other viral communicable diseases: Secondary | ICD-10-CM | POA: Insufficient documentation

## 2019-04-21 DIAGNOSIS — Z01812 Encounter for preprocedural laboratory examination: Secondary | ICD-10-CM | POA: Diagnosis not present

## 2019-04-21 LAB — TYPE AND SCREEN
ABO/RH(D): A NEG
Antibody Screen: NEGATIVE

## 2019-04-21 NOTE — Patient Instructions (Signed)
Your COVID swab is scheduled for today 04/21/2019.  Drive up in front of [facility] and remain in your vehicle.  Your procedure is scheduled on: Thursday 04/24/2019 Report to Same Day Surgery 2nd floor [facility] Entrance-take elevator on left to 2nd floor.  Check in with surgery information desk.) To find out your arrival time, call [phone] 1:00-3:00 PM on Wednesday 04/23/2019  Remember: Instructions that are not followed completely may result in serious medical risk, up to and including death, or upon the discretion of your surgeon and anesthesiologist your surgery may need to be rescheduled.    __x__ 1. Do not eat food (including mints, candies, chewing gum) after midnight the night before your procedure. You may drink clear liquids up to 2 hours before you are scheduled to arrive at the hospital for your procedure.  Do not drink anything within 2 hours of your scheduled arrival to the hospital.  Approved clear liquids:  --Water or Apple juice without pulp  --Clear carbohydrate beverage such as Gatorade or Powerade  --Black Coffee or Clear Tea (No milk, no creamers, do not add anything to the coffee or tea)    __x__ 2. No Alcohol or smoking for 24 hours before or after surgery.   __x__ 3. Notify your doctor if there is any change in your medical condition (cold, fever, infections).   __x__ 4. On the morning of surgery brush your teeth with toothpaste and water.  You may rinse your mouth with mouthwash if you wish.  Do not swallow any toothpaste or mouthwash.  Please read over the following fact sheets that you were given:   [facility] Preparing for Surgery and/or MRSA Information    __x__ Use CHG Soap as directed on instruction sheet.   Do not wear jewelry, make-up, hairpins, clips or nail polish on the day of surgery.  Do not wear lotions, powders, deodorant, or perfumes.   Do not shave below the face/neck 48 hours prior to surgery.   Do not bring  valuables to the hospital.    [facility] is not responsible for any belongings or valuables.               Contacts, eyeglasses may not be worn into surgery.  For patients discharged on the day of surgery, you will NOT be permitted to drive yourself home.  You must have a responsible adult with you for 24 hours after surgery.  __x__ Take these medicines on the morning of surgery with a SMALL SIP OF WATER:  1. Ativan, Imitrex if needed  __x__ If applicable, follow recommendations from Cardiologist, Pulmonologist or PCP regarding stopping blood thinners such as Aspirin, Coumadin, Plavix, Eliquis, Effient, Pradaxa, and Pletal.  __x__ STARTING TODAY: Do not take any Anti-inflammatories such as Advil, Ibuprofen, Motrin, Aleve, Naproxen, Naprosyn, BC/Goodies powders or aspirin products. You may take Tylenol if needed.   __x__ STARTING TODAY: Do not take any supplements until after surgery. You may continue to take Vitamin D, Vitamin B, and multivitamin.  Reviewed instructions with patient via telephone interview 04/21/2019 1:20 PM.  Patient's questions answered and will receive printed copy of instructions at time of COVID swab.   _________________________

## 2019-04-22 ENCOUNTER — Encounter: Payer: 59 | Admitting: Obstetrics and Gynecology

## 2019-04-22 DIAGNOSIS — E559 Vitamin D deficiency, unspecified: Secondary | ICD-10-CM | POA: Diagnosis not present

## 2019-04-22 DIAGNOSIS — Z6829 Body mass index (BMI) 29.0-29.9, adult: Secondary | ICD-10-CM | POA: Diagnosis not present

## 2019-04-22 LAB — SARS CORONAVIRUS 2 (TAT 6-24 HRS): SARS Coronavirus 2: NEGATIVE

## 2019-04-24 ENCOUNTER — Encounter: Payer: Self-pay | Admitting: Obstetrics and Gynecology

## 2019-04-24 ENCOUNTER — Ambulatory Visit: Payer: 59 | Admitting: Anesthesiology

## 2019-04-24 ENCOUNTER — Encounter
Admission: RE | Disposition: A | Discharge status: Home / Self Care | Payer: Self-pay | Source: Home / Self Care | Attending: Obstetrics and Gynecology

## 2019-04-24 ENCOUNTER — Other Ambulatory Visit: Payer: Self-pay

## 2019-04-24 ENCOUNTER — Ambulatory Visit
Admission: RE | Admit: 2019-04-24 | Discharge: 2019-04-24 | Disposition: A | Discharge status: Home / Self Care | Payer: 59 | Attending: Obstetrics and Gynecology | Admitting: Obstetrics and Gynecology

## 2019-04-24 DIAGNOSIS — Z9889 Other specified postprocedural states: Secondary | ICD-10-CM

## 2019-04-24 DIAGNOSIS — Z302 Encounter for sterilization: Secondary | ICD-10-CM | POA: Diagnosis not present

## 2019-04-24 DIAGNOSIS — N8312 Corpus luteum cyst of left ovary: Secondary | ICD-10-CM | POA: Diagnosis not present

## 2019-04-24 DIAGNOSIS — Z4002 Encounter for prophylactic removal of ovary: Secondary | ICD-10-CM | POA: Diagnosis not present

## 2019-04-24 DIAGNOSIS — Z1501 Genetic susceptibility to malignant neoplasm of breast: Secondary | ICD-10-CM | POA: Insufficient documentation

## 2019-04-24 DIAGNOSIS — K660 Peritoneal adhesions (postprocedural) (postinfection): Secondary | ICD-10-CM | POA: Insufficient documentation

## 2019-04-24 DIAGNOSIS — Z79890 Hormone replacement therapy (postmenopausal): Secondary | ICD-10-CM | POA: Diagnosis not present

## 2019-04-24 DIAGNOSIS — N838 Other noninflammatory disorders of ovary, fallopian tube and broad ligament: Secondary | ICD-10-CM | POA: Insufficient documentation

## 2019-04-24 DIAGNOSIS — Z803 Family history of malignant neoplasm of breast: Secondary | ICD-10-CM | POA: Insufficient documentation

## 2019-04-24 DIAGNOSIS — N7011 Chronic salpingitis: Secondary | ICD-10-CM | POA: Diagnosis not present

## 2019-04-24 HISTORY — PX: LAPAROSCOPIC BILATERAL SALPINGECTOMY: SHX5889

## 2019-04-24 LAB — ABO/RH: ABO/RH(D): A NEG

## 2019-04-24 LAB — POCT PREGNANCY, URINE: Preg Test, Ur: NEGATIVE

## 2019-04-24 SURGERY — SALPINGECTOMY, BILATERAL, LAPAROSCOPIC
Anesthesia: General | Site: Abdomen | Laterality: Bilateral

## 2019-04-24 MED ORDER — PROPOFOL 10 MG/ML IV BOLUS
INTRAVENOUS | Status: DC | PRN
  Administered 2019-04-24: 200 mg via INTRAVENOUS

## 2019-04-24 MED ORDER — MIDAZOLAM HCL 2 MG/2ML IJ SOLN
INTRAMUSCULAR | Status: DC | PRN
  Administered 2019-04-24: 2 mg via INTRAVENOUS

## 2019-04-24 MED ORDER — BUPIVACAINE HCL (PF) 0.5 % IJ SOLN
INTRAMUSCULAR | Status: AC
  Filled 2019-04-24: qty 30

## 2019-04-24 MED ORDER — LACTATED RINGERS IV SOLN: INTRAVENOUS | Status: DC

## 2019-04-24 MED ORDER — IBUPROFEN 800 MG PO TABS: 800.0000 mg | ORAL_TABLET | Freq: Three times a day (TID) | ORAL | 1 refills | Status: DC | PRN

## 2019-04-24 MED ORDER — OXYCODONE HCL 5 MG PO TABS
ORAL_TABLET | ORAL | Status: AC
  Administered 2019-04-24: 5 mg via ORAL
  Filled 2019-04-24: qty 1

## 2019-04-24 MED ORDER — COMBIPATCH 0.05-0.25 MG/DAY TD PTTW: 1.0000 | MEDICATED_PATCH | TRANSDERMAL | 11 refills | Status: DC

## 2019-04-24 MED ORDER — FENTANYL CITRATE (PF) 100 MCG/2ML IJ SOLN
INTRAMUSCULAR | Status: AC
  Administered 2019-04-24: 50 ug via INTRAVENOUS
  Filled 2019-04-24: qty 2

## 2019-04-24 MED ORDER — ROCURONIUM BROMIDE 100 MG/10ML IV SOLN
INTRAVENOUS | Status: DC | PRN
  Administered 2019-04-24: 30 mg via INTRAVENOUS

## 2019-04-24 MED ORDER — LIDOCAINE HCL (PF) 2 % IJ SOLN
INTRAMUSCULAR | Status: AC
  Filled 2019-04-24: qty 10

## 2019-04-24 MED ORDER — FAMOTIDINE 20 MG PO TABS
20.0000 mg | ORAL_TABLET | Freq: Once | ORAL | Status: AC
  Administered 2019-04-24: 20 mg via ORAL

## 2019-04-24 MED ORDER — OXYCODONE HCL 5 MG PO TABS: 5.0000 mg | ORAL_TABLET | Freq: Once | ORAL | Status: AC | PRN

## 2019-04-24 MED ORDER — FENTANYL CITRATE (PF) 100 MCG/2ML IJ SOLN
25.0000 ug | INTRAMUSCULAR | Status: DC | PRN
  Administered 2019-04-24 (×2): 50 ug via INTRAVENOUS

## 2019-04-24 MED ORDER — FAMOTIDINE 20 MG PO TABS
ORAL_TABLET | ORAL | Status: AC
  Filled 2019-04-24: qty 1

## 2019-04-24 MED ORDER — PHENYLEPHRINE HCL (PRESSORS) 10 MG/ML IV SOLN
INTRAVENOUS | Status: DC | PRN
  Administered 2019-04-24 (×3): 100 ug via INTRAVENOUS
  Administered 2019-04-24: 200 ug via INTRAVENOUS

## 2019-04-24 MED ORDER — DEXAMETHASONE SODIUM PHOSPHATE 10 MG/ML IJ SOLN
INTRAMUSCULAR | Status: DC | PRN
  Administered 2019-04-24: 10 mg via INTRAVENOUS

## 2019-04-24 MED ORDER — OXYCODONE HCL 5 MG/5ML PO SOLN: 5.0000 mg | Freq: Once | ORAL | Status: AC | PRN

## 2019-04-24 MED ORDER — BUPIVACAINE HCL 0.5 % IJ SOLN
INTRAMUSCULAR | Status: DC | PRN
  Administered 2019-04-24: 16 mL

## 2019-04-24 MED ORDER — FENTANYL CITRATE (PF) 100 MCG/2ML IJ SOLN
INTRAMUSCULAR | Status: AC
  Filled 2019-04-24: qty 2

## 2019-04-24 MED ORDER — ONDANSETRON HCL 4 MG/2ML IJ SOLN: 4.0000 mg | Freq: Once | INTRAMUSCULAR | Status: AC

## 2019-04-24 MED ORDER — FENTANYL CITRATE (PF) 100 MCG/2ML IJ SOLN
INTRAMUSCULAR | Status: DC | PRN
  Administered 2019-04-24: 100 ug via INTRAVENOUS

## 2019-04-24 MED ORDER — MIDAZOLAM HCL 2 MG/2ML IJ SOLN
INTRAMUSCULAR | Status: AC
  Filled 2019-04-24: qty 2

## 2019-04-24 MED ORDER — ONDANSETRON HCL 4 MG/2ML IJ SOLN
INTRAMUSCULAR | Status: AC
  Administered 2019-04-24: 4 mg via INTRAVENOUS
  Filled 2019-04-24: qty 2

## 2019-04-24 MED ORDER — SUGAMMADEX SODIUM 200 MG/2ML IV SOLN
INTRAVENOUS | Status: DC | PRN
  Administered 2019-04-24: 200 mg via INTRAVENOUS

## 2019-04-24 MED ORDER — LIDOCAINE HCL (CARDIAC) PF 100 MG/5ML IV SOSY
PREFILLED_SYRINGE | INTRAVENOUS | Status: DC | PRN
  Administered 2019-04-24: 100 mg via INTRAVENOUS

## 2019-04-24 MED ORDER — OXYCODONE-ACETAMINOPHEN 5-325 MG PO TABS: 1.0000 | ORAL_TABLET | ORAL | 0 refills | Status: DC | PRN

## 2019-04-24 MED ORDER — SUGAMMADEX SODIUM 200 MG/2ML IV SOLN
INTRAVENOUS | Status: AC
  Filled 2019-04-24: qty 2

## 2019-04-24 MED ORDER — PROPOFOL 10 MG/ML IV BOLUS
INTRAVENOUS | Status: AC
  Filled 2019-04-24: qty 20

## 2019-04-24 MED ORDER — ONDANSETRON HCL 4 MG/2ML IJ SOLN
INTRAMUSCULAR | Status: DC | PRN
  Administered 2019-04-24: 4 mg via INTRAVENOUS

## 2019-04-24 MED ORDER — KETOROLAC TROMETHAMINE 30 MG/ML IJ SOLN
INTRAMUSCULAR | Status: DC | PRN
  Administered 2019-04-24: 30 mg via INTRAVENOUS

## 2019-04-24 SURGICAL SUPPLY — 36 items
ANCHOR TIS RET SYS 235ML (MISCELLANEOUS) ×1 IMPLANT
BAG URINE DRAIN 2000ML AR STRL (UROLOGICAL SUPPLIES) ×2 IMPLANT
BLADE SURG SZ11 CARB STEEL (BLADE) ×2 IMPLANT
CANISTER SUCT 1200ML W/VALVE (MISCELLANEOUS) ×2 IMPLANT
CATH FOLEY 2WAY  5CC 16FR (CATHETERS) ×1
CATH URTH 16FR FL 2W BLN LF (CATHETERS) ×1 IMPLANT
CHLORAPREP W/TINT 26 (MISCELLANEOUS) ×2 IMPLANT
COVER WAND RF STERILE (DRAPES) ×2 IMPLANT
DERMABOND ADVANCED (GAUZE/BANDAGES/DRESSINGS) ×1
DERMABOND ADVANCED .7 DNX12 (GAUZE/BANDAGES/DRESSINGS) ×1 IMPLANT
GLOVE BIO SURGEON STRL SZ7 (GLOVE) ×2 IMPLANT
GLOVE INDICATOR 7.5 STRL GRN (GLOVE) ×2 IMPLANT
GOWN STRL REUS W/ TWL LRG LVL3 (GOWN DISPOSABLE) ×2 IMPLANT
GOWN STRL REUS W/ TWL XL LVL3 (GOWN DISPOSABLE) IMPLANT
GOWN STRL REUS W/TWL LRG LVL3 (GOWN DISPOSABLE) ×2
GOWN STRL REUS W/TWL XL LVL3 (GOWN DISPOSABLE)
GRASPER SUT TROCAR 14GX15 (MISCELLANEOUS) ×1 IMPLANT
IRRIGATION STRYKERFLOW (MISCELLANEOUS) IMPLANT
IRRIGATOR STRYKERFLOW (MISCELLANEOUS)
IV LACTATED RINGERS 1000ML (IV SOLUTION) ×2 IMPLANT
KIT PINK PAD W/HEAD ARE REST (MISCELLANEOUS) ×2
KIT PINK PAD W/HEAD ARM REST (MISCELLANEOUS) ×1 IMPLANT
KIT TURNOVER CYSTO (KITS) ×2 IMPLANT
LABEL OR SOLS (LABEL) ×2 IMPLANT
NS IRRIG 500ML POUR BTL (IV SOLUTION) ×2 IMPLANT
PACK GYN LAPAROSCOPIC (MISCELLANEOUS) ×2 IMPLANT
PAD OB MATERNITY 4.3X12.25 (PERSONAL CARE ITEMS) ×2 IMPLANT
PAD PREP 24X41 OB/GYN DISP (PERSONAL CARE ITEMS) ×2 IMPLANT
SCISSORS METZENBAUM CVD 33 (INSTRUMENTS) ×1 IMPLANT
SET TUBE SMOKE EVAC HIGH FLOW (TUBING) ×2 IMPLANT
SHEARS HARMONIC ACE PLUS 36CM (ENDOMECHANICALS) ×2 IMPLANT
SLEEVE ENDOPATH XCEL 5M (ENDOMECHANICALS) ×2 IMPLANT
SUT MNCRL AB 4-0 PS2 18 (SUTURE) ×2 IMPLANT
SUT VIC AB 2-0 UR6 27 (SUTURE) ×2 IMPLANT
TROCAR ENDO BLADELESS 11MM (ENDOMECHANICALS) ×2 IMPLANT
TROCAR XCEL NON-BLD 5MMX100MML (ENDOMECHANICALS) ×2 IMPLANT

## 2019-04-24 NOTE — Anesthesia Postprocedure Evaluation (Signed)
Anesthesia Post Note  Patient: Velna Hatchet  Procedure(s) Performed: LAPAROSCOPIC BILATERAL SALPINGO- OOPHORECTOMY (Bilateral Abdomen)  Patient location during evaluation: PACU Anesthesia Type: General Level of consciousness: awake and alert Pain management: pain level controlled Vital Signs Assessment: post-procedure vital signs reviewed and stable Respiratory status: spontaneous breathing, nonlabored ventilation, respiratory function stable and patient connected to nasal cannula oxygen Cardiovascular status: blood pressure returned to baseline and stable Postop Assessment: no apparent nausea or vomiting Anesthetic complications: no     Last Vitals:  Vitals:   04/24/19 1820 04/24/19 1830  BP:  114/66  Pulse: 78 91  Resp: 13 14  Temp:    SpO2: 100% 98%    Last Pain:  Vitals:   04/24/19 1840  TempSrc:   PainSc: Asleep                 Precious Haws Aimar [place]

## 2019-04-24 NOTE — Anesthesia Post-op Follow-up Note (Signed)
Anesthesia QCDR form completed.        

## 2019-04-24 NOTE — Anesthesia Procedure Notes (Signed)
Procedure Name: Intubation Date/Time: 04/24/2019 4:40 PM Performed by: Johnna Acosta, CRNA Pre-anesthesia Checklist: Patient identified, Emergency Drugs available, Suction available, Patient being monitored and Timeout performed Patient Re-evaluated:Patient Re-evaluated prior to induction Oxygen Delivery Method: Circle system utilized Preoxygenation: Pre-oxygenation with 100% oxygen Induction Type: IV induction Ventilation: Mask ventilation without difficulty Laryngoscope Size: Miller and 2 Grade View: Grade I Tube type: Oral Tube size: 7.5 mm Number of attempts: 1 Airway Equipment and Method: Stylet Placement Confirmation: ETT inserted through vocal cords under direct vision,  positive ETCO2 and breath sounds checked- equal and bilateral Secured at: 21 cm Tube secured with: Tape Dental Injury: Teeth and Oropharynx as per pre-operative assessment

## 2019-04-24 NOTE — Op Note (Signed)
Preoperative Diagnosis: 1) 38 y.o. BRCA positive desiring prophylactic salpingo-oohorectomy  Postoperative Diagnosis: 1) 38 y.o.  BRCA positive desiring prophylactic salpingo-oohorectomy  Operation Performed: Laparoscopic bilateral tubal ligation via falope ring application  Indication: Recommendation is for risk-reducing salping-oophorectomy between age 73-40.  Ovarian cancer onset in patient with BRCA2 mutation on average occur 8-10 years later than in patient with BRCA2 mutations it is reasonable to delay risk-reducing oophorectomy until age 65-45 unless age at diagnosis in the family history warrants earlier intervention.  Salpingectomy solely is not considered standard of care for risk reduction although trial are currently looking at interval salpingectomy prior to proceeding with risk-reducing oophorectomy.  Limited data suggest a small increased uterine serous cancer risk in patient with BRCA 1 mutations.  For patient who have not elected for risk-reducing salpingo-oophorectomy transvaginal ultrasound and CA-125 screening, although of uncertain benefit, maybe started at age 58-35.  NCCN Guideline Version 1.2020 BRCA-Pathogenic/Likely Pathogenic Variant - Positive Management   Surgeon: Malachy Mood, MD  Anesthesia: General  Preoperative Antibiotics: none  Estimated Blood Loss: 10 mL  IV Fluids: 671m  Urine Output:: 2054m Drains or Tubes: none  Implants: none  Specimens Removed: Bilateral tubes and ovaries  Complications: none  Intraoperative Findings: Normal tubes, ovaries, and uterus.  Good 1cm knuckle of tube applied within each falope ring.  Patient Condition: stable  Procedure in Detail:  Patient was taken to the operating room where she was administered general anesthesia.  She was positioned in the dorsal lithotomy position utilizing Allen stirups, prepped and draped in the usual sterile fashion.  Prior to proceeding with procedure a time out was performed.   Attention was turned to the patient's pelvis.  A red rubber catheter was used to empty the patient's bladder.  An operative speculum was placed to allow visualization of the cervix.  The anterior lip of the cervix was grasped with a single tooth tenaculum, and a Hulka tenaculum was placed to allow manipulation of the uterus.  The operative speculum and single tooth tenaculum were then removed.  Attention was turned to the patient's abdomen.  The umbilicus was infiltrated with 1% Sensorcaine, before making a stab incision using an 11 blade scalpel.  A 33m67mxcel trocar was then used to gain direct entry into the peritoneal cavity utilizing the camera to visualize progress of the trocar during placement.  Once peritoneal entry had been achieved, insufflation was started and pneumoperitoneum established at a pressure of 133m733m   General inspection of the abdomen revealed the above noted findings.  A spot 2cm midline above the pubic symphysis was injected with 1% Sensorcaine and a stab incision was made using an 11 blade scalpel.  The33mme13ml trocar was place suprapubic through this incision under direct visualization.  The 33mm u60mlical trocar site was stepped up to an 11mm E73m trocar and a third 33mm exc53mtrocar was placed in the left lower quadrant.  The right tube was identified and grasped at its fimbriated end putting tension on the IP ligament.  The IP ligament was transected using a 33mm harm44mc scalpel.  Ureter was visualized well below the place of dissection.  The ovary and tube were then transected from their attachments to the mesosalpinx and uterus using the 33mm Harmo13m scalpel.   The left tube and ovary were identified and noted to have some thin peritoneal adhesions which were taken down with the harmonic scalpel.  There was a greater gap between the distal and proximal ends of the tube from the patient  prior partial salpingectomy, so the specimen was removed in two block with the first containing  the left ovary and distal fimbriated end of the fallopian tube, the second specimen consisting of the proximal fallopian tube.  The left ureter was once again well visualized below the plane of dissection.  The pedicles were inspected and noted to be hemostatic.  A Carter-Thompson device and 0 Vicryl were used to close the 22m port site fascia.  Pneumoperitoneum was evacuated.  The trocars were removed.  The 18mtrocar site was closed with 4-0 Monocryl in a subcuticular fashion.  All trocar sites were then dressed with surgical skin glue.  The Hulka tenaculum was removed.  Sponge needle and instrument counts were correct time two.  The patient tolerated the procedure well and was taken to the recovery room in stable condition.

## 2019-04-24 NOTE — Transfer of Care (Signed)
Immediate Anesthesia Transfer of Care Note  Patient: Shannon George  Procedure(s) Performed: LAPAROSCOPIC BILATERAL SALPINGO- OOPHORECTOMY (Bilateral Abdomen)  Patient Location: PACU  Anesthesia Type:General  Level of Consciousness: sedated  Airway & Oxygen Therapy: Patient Spontanous Breathing and Patient connected to face mask oxygen  Post-op Assessment: Report given to RN and Post -op Vital signs reviewed and stable  Post vital signs: Reviewed  Last Vitals:  Vitals Value Taken Time  BP 116/82 04/24/19 1745  Temp    Pulse 110 04/24/19 1746  Resp 20 04/24/19 1746  SpO2 100 % 04/24/19 1746  Vitals shown include unvalidated device data.  Last Pain:  Vitals:   04/24/19 1341  TempSrc: Tympanic  PainSc: 0-No pain         Complications: No apparent anesthesia complications

## 2019-04-24 NOTE — H&P (Signed)
Obstetrics & Gynecology Surgery H&P    Chief Complaint: Scheduled Surgery   History of Present Illness: Patient is a 38 y.o. [id number] presenting for scheduled BSO, for the treatment or further evaluation of BRCA mutation.   Prior Treatments prior to proceeding with surgery include: surveillance with serial ultrasound and CA-125 measurements  Preoperative Pap: 03/21/2018 NIL HPV negative Preoperative Endometrial biopsy: N/A Preoperative Ultrasound: 04/14/2019 Unchanged from prior other than new fluid collection noted within the right fallopian tube 47m, and read as complex.  Given prior endometrial ablation suspect a likely hematosalpinx secondary to regrowth of endometrial tissue in the cornua.  Review of Systems:10 point review of systems  Past Medical History:  Past Medical History:  Diagnosis Date  . BRCA1 gene mutation positive 2014   BRCA 1 valine substitution error. [facility]  . Family history of breast cancer   . History of Papanicolaou smear of cervix 04/24/13; 06/04/14   -/+; neg  . Migraine   . Pap smear abnormality of cervix with ASCUS favoring dysplasia   . Vitamin D deficiency     Past Surgical History:  Past Surgical History:  Procedure Laterality Date  . BREAST BIOPSY Left 06/20/2017   FIBROCYSTIC CHANGE of MRI bx, dumbell marker  . CERVICAL BIOPSY  W/ LOOP [place]RO[place] EX[place]ISION  [place]  2006, [place]  . [place]   [facility]  . TUBAL LIGATION  2008    Family History:  Family History  Problem Relation Age of Onset  . Cancer Father 551      lung  . Hypertension Mother   . Breast cancer Mother        378(medullary carcinoma, poorly differentiated) and 538 . Breast cancer Paternal Aunt 411 . Breast cancer Maternal Grandmother 767 . Cancer Paternal Grandmother        BLADDER; KIDNEY  . Cancer Maternal Grandfather        STOMACH    Social History:  Social History   Socioeconomic History  . Marital status: Married      Spouse name: [name]  . Number of children: 2  . Years of education: 117 . Highest education level: Not on file  Occupational History  . 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 Smoking status: Never Smoker  . Smokeless tobacco: Never Used  Substance and Sexual Activity  . Alcohol use: No    Alcohol/week: 0.0 standard drinks  . Drug use: No  . Sexual activity: Yes    Birth control/protection: None  Other Topics Concern  . Not on file  Social History Narrative  . Not on file   Social Determinants of Health   Financial Resource Strain:   . Difficulty of Paying Living Expenses: Not on file  Food Insecurity:   . Worried About RCharity fundraiserin the Last Year: Not on file  . Ran Out of Food in the Last Year: Not on file  Transportation Needs:   . Lack of Transportation (Medical): Not on file  . Lack of Transportation (Non-Medical): Not on file  Physical Activity:   . Days of Exercise per Week: Not on file  . Minutes of Exercise per Session: Not on file  Stress:   . Feeling of Stress : Not on file  Social Connections:   . Frequency of Communication with Friends and Family: Not on file  . Frequency of Social Gatherings  with Friends and Family: Not on file  . Attends Religious Services: Not on file  . Active Member of Clubs or Organizations: Not on file  . Attends Archivist Meetings: Not on file  . Marital Status: Not on file  Intimate Partner Violence:   . Fear of Current or Ex-Partner: Not on file  . Emotionally Abused: Not on file  . Physically Abused: Not on file  . Sexually Abused: Not on file    Allergies:  Allergies  Allergen Reactions  . Amoxicillin Rash    Did it involve swelling of the face/tongue/throat, SOB, or low BP? No Did it involve sudden or severe rash/hives, skin peeling, or any reaction on the inside of your mouth or nose? No Did you need to seek medical attention at a hospital or doctor's office?  No When did it last happen?10+ years ago  . Penicillins Rash    Did it involve swelling of the face/tongue/throat, SOB, or low BP? No Did it involve sudden or severe rash/hives, skin peeling, or any reaction on the inside of your mouth or nose? No Did you need to seek medical attention at a hospital or doctor's office? No When did it last happen?10+ years ago If all above answers are "NO", may proceed with cephalosporin use.     Medications: Prior to Admission medications   Medication Sig Start Date End Date Taking? Authorizing Provider  fluticasone (FLONASE) 50 MCG/ACT nasal spray Place 1 spray into both nostrils 2 (two) times daily as needed for allergies or rhinitis. 16/94/50  Yes Copland, Deirdre Evener, PA-C  Norethindrone-Ethinyl Estradiol-Fe Biphas (LO LOESTRIN FE) 1 MG-10 MCG / 10 MCG tablet Take 1 tablet by mouth daily. PT GETS SAMPLES Patient taking differently: Take 1 tablet by mouth daily.  38/88/28  Yes Copland, Deirdre Evener, PA-C  ibuprofen (ADVIL,MOTRIN) 800 MG tablet Take 1 tablet (800 mg total) every 8 (eight) hours as needed by mouth. Patient not taking: Reported on 04/24/2019 00/34/91   Copland, Alicia B, PA-C  LORazepam (ATIVAN) 0.5 MG tablet Take 1 tablet (0.5 mg total) every 8 (eight) hours as needed by mouth for anxiety. Patient not taking: Reported on 04/24/2019 79/15/05   Copland, Deirdre Evener, PA-C  SUMAtriptan (IMITREX) 100 MG tablet Take 1 tablet (100 mg total) by mouth every 2 (two) hours as needed for migraine. May repeat in 2 hours if headache persists or recurs. Patient not taking: Reported on 04/24/2019 12/31/18   Malachy Mood, MD    Physical Exam Vitals: Blood pressure (!) 100/49, pulse 95, temperature (!) 97.5 F (36.4 C), temperature source Tympanic, resp. rate 16, height '5\' 4"'  (1.626 m), weight 78 kg, SpO2 100 %. General: NAD HEENT: normocephalic, anicteric Pulmonary: CTAB, No increased work of breathing Cardiovascular: RRR, distal pulses  2+ Abdomen: soft, non-tender, non-distended Extremities: no edema, erythema, or tenderness Neurologic: Grossly intact Psychiatric: mood appropriate, affect full  Imaging US Transvaginal Non-OB  Result Date: 04/15/2019 Patient Name: Shannon George DOB: 1980-06-11 MRN: [id number] ULTRASOUND REPORT Location: [facility] Date of Service: 04/15/2019 Indications: BRCA gene positive Findings: The uterus is anteverted and measures 6.5 x 4.1 x 3.7cm. Heterogeneous anterior fundal uterine tissue measuring 1.9 x 1.9 x 1.6cm. Seen on all prior ultrasounds and is relatively unchanged. Fibroid vs other. The Endometrium measures 3.2 mm. 47m complex fluid filled right endometrium with fluid filled fallopian tube. New this exam. Right Ovary measures 2.8 x 2.1 x 1.9 cm. It is normal in appearance. Left Ovary measures 3.0 x 1.6 x  2.0 cm with dominant follicles. Survey of the adnexa demonstrates no adnexal masses. There is no free fluid in the cul de sac. Impression: 1. Normal appearance of bilateral ovaries. 2. Again seen heterogeneous fundal anterior myometrium. 3. Complex fluid filled right endometrial canal/fallopian tube. New this exam. Vita Barley, RT The ultrasound images and findings were reviewed by me and I agree with the above report. Prentice Docker, MD, [facility], [facility] 04/15/2019 1:37 PM      Assessment: 38 y.o. [id number] presenting for scheduled laparoscopic BSO  Plan: 1) I have had a careful discussion with this patient about all the options available and the risk/benefits of each. I have fully informed this patient that a laparoscopy may subject her to a variety of discomforts and risks: She understands that most patients have surgery with little difficulty, but problems can happen ranging from minor to fatal. These include nausea, vomiting, pain, bleeding, infection, poor healing, hernia, or formation of adhesions. Unexpected reactions may occur from any drug or  anesthetic given. Unintended injury may occur to other pelvic or abdominal structures such as Fallopian tubes, ovaries, bladder, ureter (tube from kidney to bladder), or bowel. Nerves going from the pelvis to the legs may be injured. Any such injury may require immediate or later additional surgery to correct the problem. Excessive blood loss requiring transfusion is very unlikely but possible. Dangerous blood clots may form in the legs or lungs. Physical and sexual activity will be restricted in varying degrees for an indeterminate period of time but most often 2-4 weeks. She understands that the plan is to do this laparoscopically, however, there is a chance that this will need to be performed via a larger incision. Finally, she understands that it is impossible to list every possible undesirable effect and that the condition for which surgery is done is not always cured or significantly improved, and in rare cases may be even worsen. Ample time was given to answer all questions.   2) Routine postoperative instructions were reviewed with the patient and her family in detail today including the expected length of recovery and likely postoperative course.  The patient concurred with the proposed plan, giving informed written consent for the surgery today.  Patient instructed on the importance of being NPO after midnight prior to her procedure.  If warranted preoperative prophylactic antibiotics and SCDs ordered on call to the OR to meet SCIP guidelines and adhere to recommendation laid forth in [place] Number 104 May 2009  "Antibiotic Prophylaxis for Gynecologic Procedures".     Malachy Mood, MD, [facility], [facility] 04/24/2019, 4:10 PM

## 2019-04-24 NOTE — Anesthesia Preprocedure Evaluation (Signed)
Anesthesia Evaluation  Patient identified by MRN, date of birth, ID band Patient awake    Reviewed: Allergy & Precautions, H&P , NPO status , Patient's Chart, lab work & pertinent test results  History of Anesthesia Complications Negative for: history of anesthetic complications  Airway Mallampati: II  TM Distance: >3 FB Neck ROM: full    Dental  (+) Chipped   Pulmonary neg pulmonary ROS, neg recent URI,           Cardiovascular Exercise Tolerance: Good (-) angina(-) Past MI and (-) DOE negative cardio ROS       Neuro/Psych  Headaches, negative psych ROS   GI/Hepatic negative GI ROS, Neg liver ROS,   Endo/Other  negative endocrine ROS  Renal/GU      Musculoskeletal   Abdominal   Peds  Hematology negative hematology ROS (+)   Anesthesia Other Findings Past Medical History: 2014: BRCA1 gene mutation positive     Comment:  BRCA 1 valine substitution error. [facility] No date: Family history of breast cancer 04/24/13; 06/04/14: History of Papanicolaou smear of cervix     Comment:  -/+; neg No date: Migraine No date: Pap smear abnormality of cervix with ASCUS favoring dysplasia No date: Vitamin D deficiency  Past Surgical History: 06/20/2017: BREAST BIOPSY; Left     Comment:  FIBROCYSTIC CHANGE of MRI bx, dumbell marker 2001: CERVICAL BIOPSY  W/ LOOP ELECTRODE EXCISION     Comment:  [place] 2006, 2008: CESAREAN SECTION 2013: NOVASURE ABLATION     Comment:  [facility] 2008: TUBAL LIGATION  BMI    Body Mass Index: 29.52 kg/m      Reproductive/Obstetrics negative OB ROS                             Anesthesia Physical Anesthesia Plan  ASA: II  Anesthesia Plan: General ETT   Post-op Pain Management:    Induction: Intravenous  PONV Risk Score and Plan: Ondansetron, Dexamethasone, Midazolam and Treatment may vary due to age or medical condition  Airway Management Planned: Oral  ETT  Additional Equipment:   Intra-op Plan:   Post-operative Plan: Extubation in OR  Informed Consent: I have reviewed the patients History and Physical, chart, labs and discussed the procedure including the risks, benefits and alternatives for the proposed anesthesia with the patient or authorized representative who has indicated his/her understanding and acceptance.     Dental Advisory Given  Plan Discussed with: Anesthesiologist, CRNA and Surgeon  Anesthesia Plan Comments: (Patient consented for risks of anesthesia including but not limited to:  - adverse reactions to medications - damage to teeth, lips or other oral mucosa - sore throat or hoarseness - Damage to heart, brain, lungs or loss of life  Patient voiced understanding.)        Anesthesia Quick Evaluation

## 2019-04-24 NOTE — Discharge Instructions (Signed)

## 2019-04-25 ENCOUNTER — Telehealth: Payer: Self-pay

## 2019-04-25 ENCOUNTER — Other Ambulatory Visit: Payer: Self-pay | Admitting: Obstetrics and Gynecology

## 2019-04-25 MED ORDER — COMBIPATCH 0.05-0.25 MG/DAY TD PTTW: 1.0000 | MEDICATED_PATCH | TRANSDERMAL | 11 refills | Status: DC

## 2019-04-25 NOTE — Telephone Encounter (Signed)
Pharmacist states AMS sent patient 3 rx's to [facility]. Patient picked up 2, but did not get the Estradiol patch as it was going to be over $200. [facility] accidentally deleted the rx and was unable to transfer it to [place]. Verbal given estradiol-norethindrone (COMBIPATCH) 0.05-0.25 MG/DAY Place 1 patch onto the skin 2 (two) times a week.  Dispense: 12 patch RF:11 ordered

## 2019-04-28 ENCOUNTER — Other Ambulatory Visit: Payer: 59

## 2019-04-28 LAB — SURGICAL PATHOLOGY

## 2019-04-29 DIAGNOSIS — R635 Abnormal weight gain: Secondary | ICD-10-CM | POA: Diagnosis not present

## 2019-04-29 DIAGNOSIS — E559 Vitamin D deficiency, unspecified: Secondary | ICD-10-CM | POA: Diagnosis not present

## 2019-04-29 DIAGNOSIS — Z6828 Body mass index (BMI) 28.0-28.9, adult: Secondary | ICD-10-CM | POA: Diagnosis not present

## 2019-04-29 DIAGNOSIS — R5383 Other fatigue: Secondary | ICD-10-CM | POA: Diagnosis not present

## 2019-05-05 ENCOUNTER — Ambulatory Visit: Payer: 59

## 2019-05-06 ENCOUNTER — Other Ambulatory Visit: Payer: 59

## 2019-05-06 DIAGNOSIS — E559 Vitamin D deficiency, unspecified: Secondary | ICD-10-CM | POA: Diagnosis not present

## 2019-05-06 DIAGNOSIS — Z6829 Body mass index (BMI) 29.0-29.9, adult: Secondary | ICD-10-CM | POA: Diagnosis not present

## 2019-05-12 ENCOUNTER — Encounter: Payer: Self-pay | Admitting: Obstetrics and Gynecology

## 2019-05-12 ENCOUNTER — Other Ambulatory Visit: Payer: Self-pay

## 2019-05-12 ENCOUNTER — Ambulatory Visit (INDEPENDENT_AMBULATORY_CARE_PROVIDER_SITE_OTHER): Payer: 59 | Admitting: Obstetrics and Gynecology

## 2019-05-12 VITALS — BP 110/60 | Ht 64.0 in | Wt 172.8 lb

## 2019-05-12 DIAGNOSIS — Z4889 Encounter for other specified surgical aftercare: Secondary | ICD-10-CM

## 2019-05-12 NOTE — Progress Notes (Signed)
Postoperative Follow-up Shannon George presents post op from laparoscopic bilateral salpingo-oophorectomy 2weeks ago for BRCA carrier .  Subjective: Shannon George reports some improvement in her preop symptoms. Eating a regular diet without difficulty. The Shannon George is not having any pain.  Activity: normal activities of daily living. Has not started combi-patch  Objective: Blood pressure 110/60, height _0  (1.626 m), weight 172 lb 12.8 oz (78.4 kg).  General: NAD Pulmonary: no increased work of breathing Abdomen: soft, non-tender, non-distended, incision(s) D/C/I Extremities: no edema Neurologic: normal gait    Admission on 04/24/2019, Discharged on 04/24/2019  Component Date Value Ref Range Status  . ABO/RH(D) 04/24/2019    Final                   Value:A NEG Performed at [facility], [address] [place], [place] [address]   . Preg Test, Ur 04/24/2019 NEGATIVE  NEGATIVE Final   Comment:        THE SENSITIVITY OF THIS METHODOLOGY IS >24 mIU/mL   . SURGICAL PATHOLOGY 04/24/2019    Final-Edited                   Value:SURGICAL PATHOLOGY CASE: [id number] Shannon George: Shannon George Surgical Pathology Report     Specimen Submitted: A. Fallopian tube, right B. Fallopian tube, left  Clinical History: BRCA gene positive, hydrosalpinx    DIAGNOSIS: A. FALLOPIAN TUBE AND OVARY, RIGHT; SALPINGO-OOPHORECTOMY: - FALLOPIAN TUBE:      - BENIGN PARATUBAL CYSTS.      - OTHERWISE NO SIGNIFICANT HISTOPATHOLOGIC CHANGE. - OVARY:      - BENIGN PHYSIOLOGIC CHANGES.  B. FALLOPIAN TUBE AND OVARY, LEFT; SALPINGO-OOPHORECTOMY: - FALLOPIAN TUBE:      - NO SIGNIFICANT HISTOPATHOLOGIC CHANGE. - OVARY:      - BENIGN PHYSIOLOGIC CHANGES, INCLUDING HEMORRHAGIC CORPUS LUTEUM.  GROSS DESCRIPTION: A. Labeled: Right fallopian tube and right ovary Received: In formalin Fallopian tube: In formalin    Integrity: Intact, complete with fimbriated end    Size: 4.5 cm in length and  0.7 cm in diameter    Serosa: Pink-tan smooth and shiny.  There are 2 paratubal cysts 0.4 and 0.6 cm in greatest dimension w                         hich contain clear fluid.    On sections: Grossly unremarkable lumen present    Perforation: Not present Ovary: Integrity: Intact Size: 3.5 x 1.5 x 1.5 Weight: 4 g Outer surface: Pink and smooth On sectioning: Multiple cystic changes.  Corpus luteum is present Additional abnormalities: Not identified Summary of Sections: 1-2-entirely submitted Fallopian tube 3-5-entirely submitted ovary  B. Labeled: Left fallopian tube and left ovary with cyst wall Received: In formalin Fallopian tube: In formalin    Integrity: Intact, complete with fimbriated end    Size: 3.0 cm in length and 0.7 cm in diameter    Serosa: Pink-tan smooth and shiny    On sections: Grossly unremarkable lumen present    Perforation: Not present Ovary: Integrity: Intact Size: 4.0 x 2.0 x 1.5 Weight: 4.5 g Outer surface: Pink and smooth On sectioning: Multiple cystic changes.  Corpus luteum is present Additional abnormalities: Not identified Summary of Sections: 1-2-entirely submitted Fallopia                         n tube 3-5-entirely submitted ovary  Final Diagnosis performed by Myrle Sheng  Ronnald Ramp, MD.   Electronically signed 04/28/2019 10:55:47AM The electronic signature indicates that the named Attending Pathologist has evaluated the specimen Technical component performed at [facility], [address], [place], [place] [address] Lab: [phone] Dir: Rush Farmer, MD, MMM  Professional component performed at [facility], [facility], [place], [place], [place] [address] Lab: [phone] Dir: Dellia Nims. Rubinas, MD     Assessment: 39 y.o. s/p laproscopic BSO stable  Plan: Shannon George has done well after surgery with no apparent complications.  I have discussed the post-operative course to date, and the expected progress moving forward.  The  Shannon George understands what complications to be concerned about.  I will see the Shannon George in routine follow up, or sooner if needed.    Activity plan: No restriction.   Malachy Mood, MD, [facility], [facility] 05/12/2019, 8:38 AM

## 2019-05-13 DIAGNOSIS — E559 Vitamin D deficiency, unspecified: Secondary | ICD-10-CM | POA: Diagnosis not present

## 2019-05-13 DIAGNOSIS — Z6829 Body mass index (BMI) 29.0-29.9, adult: Secondary | ICD-10-CM | POA: Diagnosis not present

## 2019-05-14 ENCOUNTER — Ambulatory Visit: Payer: 59 | Attending: Internal Medicine

## 2019-05-14 DIAGNOSIS — Z20822 Contact with and (suspected) exposure to covid-19: Secondary | ICD-10-CM

## 2019-05-16 LAB — NOVEL CORONAVIRUS, NAA: SARS-CoV-2, NAA: NOT DETECTED

## 2019-05-20 ENCOUNTER — Other Ambulatory Visit: Payer: Self-pay | Admitting: Obstetrics and Gynecology

## 2019-05-20 MED ORDER — PREMPRO 0.625-5 MG PO TABS: 1.0000 | ORAL_TABLET | Freq: Every day | ORAL | 11 refills | Status: DC

## 2019-05-22 ENCOUNTER — Other Ambulatory Visit: Payer: Self-pay | Admitting: Obstetrics and Gynecology

## 2019-05-22 MED ORDER — ESTROGENS CONJUGATED 0.625 MG PO TABS: 0.6250 mg | ORAL_TABLET | Freq: Every day | ORAL | 11 refills | Status: DC

## 2019-05-22 MED ORDER — MEDROXYPROGESTERONE ACETATE 5 MG PO TABS: 5.0000 mg | ORAL_TABLET | Freq: Every day | ORAL | 11 refills | Status: DC

## 2019-05-27 DIAGNOSIS — Z6828 Body mass index (BMI) 28.0-28.9, adult: Secondary | ICD-10-CM | POA: Diagnosis not present

## 2019-05-27 DIAGNOSIS — E559 Vitamin D deficiency, unspecified: Secondary | ICD-10-CM | POA: Diagnosis not present

## 2019-05-28 ENCOUNTER — Other Ambulatory Visit: Payer: Self-pay | Admitting: Obstetrics and Gynecology

## 2019-05-28 DIAGNOSIS — G4700 Insomnia, unspecified: Secondary | ICD-10-CM

## 2019-05-28 MED ORDER — ZOLPIDEM TARTRATE 10 MG PO TABS: 10.0000 mg | ORAL_TABLET | Freq: Every evening | ORAL | 0 refills | Status: DC | PRN

## 2019-05-28 NOTE — Progress Notes (Signed)
Rx ambien for sleep, s/p recent BSO and on ERT.

## 2019-06-03 ENCOUNTER — Ambulatory Visit
Admission: RE | Admit: 2019-06-03 | Discharge: 2019-06-03 | Disposition: A | Discharge status: Home / Self Care | Payer: 59 | Source: Ambulatory Visit | Attending: Obstetrics and Gynecology | Admitting: Obstetrics and Gynecology

## 2019-06-03 ENCOUNTER — Encounter: Payer: Self-pay | Admitting: Obstetrics and Gynecology

## 2019-06-03 DIAGNOSIS — Z1231 Encounter for screening mammogram for malignant neoplasm of breast: Secondary | ICD-10-CM | POA: Insufficient documentation

## 2019-06-09 ENCOUNTER — Encounter: Payer: Self-pay | Admitting: Obstetrics and Gynecology

## 2019-06-09 ENCOUNTER — Ambulatory Visit (INDEPENDENT_AMBULATORY_CARE_PROVIDER_SITE_OTHER): Payer: 59 | Admitting: Obstetrics and Gynecology

## 2019-06-09 ENCOUNTER — Other Ambulatory Visit: Payer: Self-pay

## 2019-06-09 VITALS — BP 114/68 | HR 71 | Ht 64.0 in | Wt 172.0 lb

## 2019-06-09 DIAGNOSIS — Z4889 Encounter for other specified surgical aftercare: Secondary | ICD-10-CM

## 2019-06-09 MED ORDER — ESTRADIOL 1 MG PO TABS: 1.0000 mg | ORAL_TABLET | Freq: Every day | ORAL | 11 refills | Status: DC

## 2019-06-09 NOTE — Progress Notes (Signed)
Postoperative Follow-up Patient presents post op from laproscopic BSO 6weeks ago for BRCA carrier.  Subjective: Patient reports marked improvement in her preop symptoms. Eating a regular diet without difficulty. The patient is not having any pain.  Activity: normal activities of daily living.  Objective: Blood pressure 114/68, pulse 71, height 5' 4" (1.626 m), weight 172 lb (78 kg), last menstrual period 04/24/2019.  General: NAD Pulmonary: no increased work of breathing Abdomen: soft, non-tender, non-distended, incision(s) D/C/I Extremities: no edema Neurologic: normal gait    Lab on 05/14/2019  Component Date Value Ref Range Status  . SARS-CoV-2, NAA 05/14/2019 Not Detected  Not Detected Final   Comment: This nucleic acid amplification test was developed and its performance characteristics determined by [employer]. Nucleic acid amplification tests include PCR and TMA. This test has not been FDA cleared or approved. This test has been authorized by FDA under an Emergency Use Authorization (EUA). This test is only authorized for the duration of time the declaration that circumstances exist justifying the authorization of the emergency use of in vitro diagnostic tests for detection of SARS-CoV-2 virus and/or diagnosis of COVID-19 infection under section 564(b)(1) of the Act, 21 U.S.C. 983JAS-5(K) (1), unless the authorization is terminated or revoked sooner. When diagnostic testing is negative, the possibility of a false negative result should be considered in the context of a patient's recent exposures and the presence of clinical signs and symptoms consistent with COVID-19. An individual without symptoms of COVID-19 and who is not shedding SARS-CoV-2 virus would                           expect to have a negative (not detected) result in this assay.     Assessment: 39 y.o. s/p laparoscopic BSO stable  Plan: Patient has done well after surgery with no  apparent complications.  I have discussed the post-operative course to date, and the expected progress moving forward.  The patient understands what complications to be concerned about.  I will see the patient in routine follow up, or sooner if needed.    Activity plan: No restriction.  Premarin po $119 even with coupon will trial on 7m of estrace to see if cheaper   AMalachy Mood MD, [facility]2/05/2019, 8:13 AM

## 2019-06-10 ENCOUNTER — Other Ambulatory Visit: Payer: Self-pay | Admitting: Obstetrics and Gynecology

## 2019-06-10 DIAGNOSIS — Z1239 Encounter for other screening for malignant neoplasm of breast: Secondary | ICD-10-CM

## 2019-06-10 DIAGNOSIS — Z6829 Body mass index (BMI) 29.0-29.9, adult: Secondary | ICD-10-CM | POA: Diagnosis not present

## 2019-06-10 DIAGNOSIS — E559 Vitamin D deficiency, unspecified: Secondary | ICD-10-CM

## 2019-06-10 DIAGNOSIS — Z1501 Genetic susceptibility to malignant neoplasm of breast: Secondary | ICD-10-CM

## 2019-06-10 DIAGNOSIS — Z9189 Other specified personal risk factors, not elsewhere classified: Secondary | ICD-10-CM

## 2019-06-10 NOTE — Progress Notes (Signed)
Orders for scr breast MRI due to BRCA 1 pos.  Orders for Vit D due to hx of deficiency.  

## 2019-06-12 ENCOUNTER — Other Ambulatory Visit: Payer: 59

## 2019-06-12 ENCOUNTER — Other Ambulatory Visit: Payer: Self-pay

## 2019-06-12 DIAGNOSIS — E559 Vitamin D deficiency, unspecified: Secondary | ICD-10-CM

## 2019-06-13 LAB — VITAMIN D 25 HYDROXY (VIT D DEFICIENCY, FRACTURES): Vit D, 25-Hydroxy: 51.1 ng/mL (ref 30.0–100.0)

## 2019-06-23 ENCOUNTER — Ambulatory Visit (INDEPENDENT_AMBULATORY_CARE_PROVIDER_SITE_OTHER): Payer: 59

## 2019-06-23 ENCOUNTER — Other Ambulatory Visit: Payer: Self-pay

## 2019-06-23 ENCOUNTER — Ambulatory Visit
Admission: RE | Admit: 2019-06-23 | Discharge: 2019-06-23 | Disposition: A | Discharge status: Home / Self Care | Payer: 59 | Source: Ambulatory Visit | Attending: Obstetrics and Gynecology | Admitting: Obstetrics and Gynecology

## 2019-06-23 ENCOUNTER — Encounter: Payer: Self-pay | Admitting: Obstetrics and Gynecology

## 2019-06-23 DIAGNOSIS — R300 Dysuria: Secondary | ICD-10-CM

## 2019-06-23 DIAGNOSIS — Z1509 Genetic susceptibility to other malignant neoplasm: Secondary | ICD-10-CM | POA: Diagnosis not present

## 2019-06-23 DIAGNOSIS — Z1239 Encounter for other screening for malignant neoplasm of breast: Secondary | ICD-10-CM | POA: Insufficient documentation

## 2019-06-23 DIAGNOSIS — Z1501 Genetic susceptibility to malignant neoplasm of breast: Secondary | ICD-10-CM | POA: Insufficient documentation

## 2019-06-23 DIAGNOSIS — Z9189 Other specified personal risk factors, not elsewhere classified: Secondary | ICD-10-CM | POA: Insufficient documentation

## 2019-06-23 DIAGNOSIS — N6489 Other specified disorders of breast: Secondary | ICD-10-CM | POA: Diagnosis not present

## 2019-06-23 LAB — POCT URINALYSIS DIPSTICK
Bilirubin, UA: NEGATIVE
Glucose, UA: NEGATIVE
Ketones, UA: NEGATIVE
Nitrite, UA: NEGATIVE
Protein, UA: NEGATIVE
Spec Grav, UA: 1.02 (ref 1.010–1.025)
Urobilinogen, UA: NEGATIVE E.U./dL — AB
pH, UA: 6 (ref 5.0–8.0)

## 2019-06-23 MED ORDER — GADOBUTROL 1 MMOL/ML IV SOLN
8.0000 mL | Freq: Once | INTRAVENOUS | Status: AC | PRN
  Administered 2019-06-23: 08:00:00 8 mL via INTRAVENOUS

## 2019-06-24 DIAGNOSIS — Z6829 Body mass index (BMI) 29.0-29.9, adult: Secondary | ICD-10-CM | POA: Diagnosis not present

## 2019-06-24 DIAGNOSIS — I10 Essential (primary) hypertension: Secondary | ICD-10-CM | POA: Diagnosis not present

## 2019-06-25 LAB — URINE CULTURE: Organism ID, Bacteria: NO GROWTH

## 2019-06-27 ENCOUNTER — Telehealth: Payer: Self-pay | Admitting: Obstetrics and Gynecology

## 2019-06-27 ENCOUNTER — Other Ambulatory Visit: Payer: Self-pay | Admitting: Obstetrics and Gynecology

## 2019-06-27 DIAGNOSIS — G4700 Insomnia, unspecified: Secondary | ICD-10-CM

## 2019-06-27 MED ORDER — ZOLPIDEM TARTRATE 10 MG PO TABS: 10.0000 mg | ORAL_TABLET | Freq: Every evening | ORAL | 1 refills | Status: DC | PRN

## 2019-06-27 NOTE — Telephone Encounter (Signed)
Patient needs refill on Ambien.  [facility].

## 2019-06-27 NOTE — Progress Notes (Signed)
Rx RF ambien prn sleep 

## 2019-06-27 NOTE — Telephone Encounter (Signed)
Done. THx

## 2019-06-27 NOTE — Telephone Encounter (Signed)
Please advise 

## 2019-06-27 NOTE — Telephone Encounter (Signed)
Pt aware.

## 2019-07-08 DIAGNOSIS — E559 Vitamin D deficiency, unspecified: Secondary | ICD-10-CM | POA: Diagnosis not present

## 2019-07-08 DIAGNOSIS — Z6829 Body mass index (BMI) 29.0-29.9, adult: Secondary | ICD-10-CM | POA: Diagnosis not present

## 2019-07-22 DIAGNOSIS — I10 Essential (primary) hypertension: Secondary | ICD-10-CM | POA: Diagnosis not present

## 2019-07-22 DIAGNOSIS — Z6829 Body mass index (BMI) 29.0-29.9, adult: Secondary | ICD-10-CM | POA: Diagnosis not present

## 2019-08-05 DIAGNOSIS — I10 Essential (primary) hypertension: Secondary | ICD-10-CM | POA: Diagnosis not present

## 2019-08-05 DIAGNOSIS — Z6829 Body mass index (BMI) 29.0-29.9, adult: Secondary | ICD-10-CM | POA: Diagnosis not present

## 2019-08-05 DIAGNOSIS — E559 Vitamin D deficiency, unspecified: Secondary | ICD-10-CM | POA: Diagnosis not present

## 2019-08-05 DIAGNOSIS — Z713 Dietary counseling and surveillance: Secondary | ICD-10-CM | POA: Diagnosis not present

## 2019-08-07 ENCOUNTER — Other Ambulatory Visit: Payer: Self-pay | Admitting: Obstetrics and Gynecology

## 2019-08-07 DIAGNOSIS — G4700 Insomnia, unspecified: Secondary | ICD-10-CM

## 2019-08-07 MED ORDER — ZOLPIDEM TARTRATE 10 MG PO TABS: 10.0000 mg | ORAL_TABLET | Freq: Every evening | ORAL | 1 refills | Status: DC | PRN

## 2019-08-07 NOTE — Progress Notes (Signed)
Rx RF ambien prn sleep 

## 2019-08-26 DIAGNOSIS — Z6829 Body mass index (BMI) 29.0-29.9, adult: Secondary | ICD-10-CM | POA: Diagnosis not present

## 2019-08-26 DIAGNOSIS — I10 Essential (primary) hypertension: Secondary | ICD-10-CM | POA: Diagnosis not present

## 2019-09-22 ENCOUNTER — Ambulatory Visit (INDEPENDENT_AMBULATORY_CARE_PROVIDER_SITE_OTHER): Payer: 59

## 2019-09-22 ENCOUNTER — Other Ambulatory Visit: Payer: Self-pay

## 2019-09-22 DIAGNOSIS — R399 Unspecified symptoms and signs involving the genitourinary system: Secondary | ICD-10-CM

## 2019-09-22 LAB — POCT URINALYSIS DIPSTICK
Bilirubin, UA: NEGATIVE
Blood, UA: NEGATIVE
Glucose, UA: NEGATIVE
Ketones, UA: NEGATIVE
Nitrite, UA: NEGATIVE
Protein, UA: POSITIVE — AB
Spec Grav, UA: 1.025 (ref 1.010–1.025)

## 2019-09-22 MED ORDER — NITROFURANTOIN MONOHYD MACRO 100 MG PO CAPS: 100.0000 mg | ORAL_CAPSULE | Freq: Two times a day (BID) | ORAL | 0 refills | Status: AC

## 2019-09-22 NOTE — Patient Instructions (Signed)
I value your feedback and entrusting us with your care. If you get a [facility] patient survey, I would appreciate you taking the time to let us know about your experience today. Thank you!  As of April 17, 2019, your lab results will be released to your MyChart immediately, before I even have a chance to see them. Please give me time to review them and contact you if there are any abnormalities. Thank you for your patience.  

## 2019-09-22 NOTE — Progress Notes (Signed)
Pt with frequency/urgency and dysuria. Had hematuria yesterday with wiping. Hx of UTI in past. Essent neg UA. Check C&S. Rx macrobid.

## 2019-09-24 LAB — URINE CULTURE: Organism ID, Bacteria: NO GROWTH

## 2019-09-29 ENCOUNTER — Other Ambulatory Visit: Payer: Self-pay | Admitting: Obstetrics and Gynecology

## 2019-09-29 ENCOUNTER — Telehealth: Payer: Self-pay

## 2019-09-29 MED ORDER — ESTRADIOL 1 MG PO TABS: 1.0000 mg | ORAL_TABLET | Freq: Every day | ORAL | 11 refills | Status: DC

## 2019-09-29 MED ORDER — MEDROXYPROGESTERONE ACETATE 5 MG PO TABS: 5.0000 mg | ORAL_TABLET | Freq: Every day | ORAL | 11 refills | Status: DC

## 2019-09-29 NOTE — Telephone Encounter (Signed)
Pt aware.

## 2019-09-29 NOTE — Telephone Encounter (Signed)
Refill sent along with provera

## 2019-09-29 NOTE — Telephone Encounter (Signed)
Pt would like a refill on her Estrace 1mg . [facility] pharmacy

## 2019-10-10 ENCOUNTER — Other Ambulatory Visit: Payer: Self-pay | Admitting: Obstetrics & Gynecology

## 2019-10-10 MED ORDER — SULFAMETHOXAZOLE-TRIMETHOPRIM 800-160 MG PO TABS: 1.0000 | ORAL_TABLET | Freq: Two times a day (BID) | ORAL | 0 refills | Status: AC

## 2019-11-27 ENCOUNTER — Other Ambulatory Visit: Payer: Self-pay | Admitting: Obstetrics and Gynecology

## 2019-12-11 ENCOUNTER — Other Ambulatory Visit: Payer: Self-pay | Admitting: Obstetrics and Gynecology

## 2019-12-11 MED ORDER — AZITHROMYCIN 250 MG PO TABS: ORAL_TABLET | ORAL | 0 refills | Status: DC

## 2019-12-11 NOTE — Progress Notes (Signed)
Rx zpak for sinusitis, unremitting for 1 1/2 wks.

## 2019-12-22 ENCOUNTER — Other Ambulatory Visit: Payer: Self-pay | Admitting: Obstetrics and Gynecology

## 2019-12-22 DIAGNOSIS — F419 Anxiety disorder, unspecified: Secondary | ICD-10-CM

## 2019-12-22 MED ORDER — LORAZEPAM 0.5 MG PO TABS: 0.5000 mg | ORAL_TABLET | Freq: Three times a day (TID) | ORAL | 0 refills | Status: DC | PRN

## 2019-12-22 NOTE — Progress Notes (Signed)
Rx RF lorazepam prn anxiety.

## 2019-12-24 ENCOUNTER — Other Ambulatory Visit: Payer: Self-pay

## 2019-12-24 ENCOUNTER — Ambulatory Visit (INDEPENDENT_AMBULATORY_CARE_PROVIDER_SITE_OTHER): Payer: 59 | Admitting: Dermatology

## 2019-12-24 DIAGNOSIS — L409 Psoriasis, unspecified: Secondary | ICD-10-CM

## 2019-12-24 MED ORDER — TRIAMCINOLONE ACETONIDE 0.1 % EX CREA: 1.0000 "application " | TOPICAL_CREAM | CUTANEOUS | 0 refills | Status: DC

## 2019-12-24 NOTE — Progress Notes (Signed)
   New Patient Visit  Subjective  Shannon George is a 39 y.o. female who presents for the following: Rash (bil elbows, ~2wks, itchy, TMC 0.1% cr, similar flare last year, no fhx of psoriasis, no hx of rash ears, scalp, knees).  The following portions of the chart were reviewed this encounter and updated as appropriate:  Tobacco  Allergies  Meds  Problems  Med Hx  Surg Hx  Fam Hx     Review of Systems:  No other skin or systemic complaints except as noted in HPI or Assessment and Plan.  Objective  Well appearing patient in no apparent distress; mood and affect are within normal limits.  A focused examination was performed including bil elbows. Relevant physical exam findings are noted in the Assessment and Plan.  Objective  Bil elbows: Hyperkeratosis elbows with some pinkness and scale Ears, scalp, knees clear   Assessment & Plan  Psoriasis Bil elbows With persistent thick areas  Start Duobrii lotion qd, avoid f/g/a, samples x 3 Lot [id number] 06/22 Once thickness decreased may switch back to [facility]  TMC 0.1% cr aa elbows qd prn flares, avoid f/g/a  Discussed mild soap and moisturizers  Discussed psoriasis and flares, stress, trauma, dry skin, cold weather.  Psoriasis is treatable, but not curable and is genetic.  Topical steroids (such as triamcinolone, fluocinolone, fluocinonide, mometasone, clobetasol, halobetasol, betamethasone, hydrocortisone) can cause thinning and lightening of the skin if they are used for too long in the same area. Your physician has selected the right strength medicine for your problem and area affected on the body. Please use your medication only as directed by your physician to prevent side effects.   triamcinolone cream (KENALOG) 0.1 % - Bil elbows  Return if symptoms worsen or fail to improve.  I, Othelia Pulling, RMA, am acting as scribe for Sarina Ser, MD .  Documentation: I have reviewed the above documentation for accuracy and  completeness, and I agree with the above.  Sarina Ser, MD

## 2019-12-30 ENCOUNTER — Encounter: Payer: Self-pay | Admitting: Dermatology

## 2020-01-08 ENCOUNTER — Other Ambulatory Visit: Payer: Self-pay | Admitting: Obstetrics and Gynecology

## 2020-01-08 DIAGNOSIS — G4700 Insomnia, unspecified: Secondary | ICD-10-CM

## 2020-01-08 MED ORDER — ZOLPIDEM TARTRATE 10 MG PO TABS: 10.0000 mg | ORAL_TABLET | Freq: Every evening | ORAL | 1 refills | Status: DC | PRN

## 2020-01-08 NOTE — Progress Notes (Signed)
Rx RF ambien prn sleep

## 2020-01-22 DIAGNOSIS — H5213 Myopia, bilateral: Secondary | ICD-10-CM | POA: Diagnosis not present

## 2020-02-13 ENCOUNTER — Encounter: Payer: Self-pay | Admitting: Dermatology

## 2020-02-16 ENCOUNTER — Other Ambulatory Visit: Payer: Self-pay

## 2020-02-16 MED ORDER — DUOBRII 0.01-0.045 % EX LOTN: TOPICAL_LOTION | CUTANEOUS | 0 refills | Status: DC

## 2020-02-17 ENCOUNTER — Other Ambulatory Visit: Payer: Self-pay

## 2020-02-17 MED ORDER — BETAMETHASONE DIPROPIONATE 0.05 % EX CREA: TOPICAL_CREAM | Freq: Two times a day (BID) | CUTANEOUS | 0 refills | Status: DC | PRN

## 2020-02-17 MED ORDER — DUOBRII 0.01-0.045 % EX LOTN: TOPICAL_LOTION | CUTANEOUS | 0 refills | Status: DC

## 2020-02-17 NOTE — Addendum Note (Signed)
Addended by: Johnsie Kindred R on: 02/17/2020 04:42 PM   Modules accepted: Orders

## 2020-02-17 NOTE — Telephone Encounter (Signed)
Duobrii not covered. Pharmacy states patient needs to try and fail Betamethasone Cream/Gel/Lotion, Desoximetasone Cream/Gel/Oint, Fluocinonide Cream/Gel, Clobetasol or Halobetasol Cream/Oint.   Patient would like to send in alternative.

## 2020-02-17 NOTE — Progress Notes (Signed)
RX switched from [facility] to [facility].

## 2020-02-26 ENCOUNTER — Other Ambulatory Visit: Payer: Self-pay | Admitting: Obstetrics and Gynecology

## 2020-02-26 DIAGNOSIS — Z1231 Encounter for screening mammogram for malignant neoplasm of breast: Secondary | ICD-10-CM

## 2020-03-15 ENCOUNTER — Other Ambulatory Visit: Payer: Self-pay | Admitting: Obstetrics and Gynecology

## 2020-03-15 DIAGNOSIS — G4700 Insomnia, unspecified: Secondary | ICD-10-CM

## 2020-03-15 NOTE — Telephone Encounter (Signed)
Please advise 

## 2020-03-29 ENCOUNTER — Ambulatory Visit: Payer: 59 | Admitting: Obstetrics and Gynecology

## 2020-03-31 ENCOUNTER — Other Ambulatory Visit: Payer: Self-pay

## 2020-03-31 ENCOUNTER — Ambulatory Visit: Payer: 59

## 2020-03-31 ENCOUNTER — Telehealth: Payer: Self-pay

## 2020-03-31 ENCOUNTER — Other Ambulatory Visit: Payer: Self-pay | Admitting: Obstetrics & Gynecology

## 2020-03-31 DIAGNOSIS — R399 Unspecified symptoms and signs involving the genitourinary system: Secondary | ICD-10-CM

## 2020-03-31 NOTE — Telephone Encounter (Signed)
Pt needs a urine culture sent off. Having pressure during urination. UA done and normal.

## 2020-04-05 ENCOUNTER — Telehealth: Payer: Self-pay

## 2020-04-05 ENCOUNTER — Other Ambulatory Visit: Payer: Self-pay | Admitting: Obstetrics & Gynecology

## 2020-04-05 LAB — URINE CULTURE

## 2020-04-05 MED ORDER — CIPROFLOXACIN HCL 500 MG PO TABS: 500.0000 mg | ORAL_TABLET | Freq: Two times a day (BID) | ORAL | 0 refills | Status: DC

## 2020-04-05 MED ORDER — SULFAMETHOXAZOLE-TRIMETHOPRIM 800-160 MG PO TABS: 1.0000 | ORAL_TABLET | Freq: Two times a day (BID) | ORAL | 0 refills | Status: DC

## 2020-04-05 NOTE — Telephone Encounter (Signed)
Pt's urine culture is still at lab, but I checked with [facility], and the preliminary results is back. Abnormal. Shows e.coli. could you send in antibiotic for pt? She is starting to hurt really bad. Azo not helping. [facility] pharmacy

## 2020-04-05 NOTE — Progress Notes (Signed)
PCP:  Chad Cordial, PA-C   Chief Complaint  Patient presents with  . Gynecologic Exam     HPI:      [name] is a 39 y.o. G2P2002 who LMP was Patient's last menstrual period was 04/24/2019., presents today for her annual examination.  Her menses are absent due to endometrial ablation 2013/lap BSO 12/20 due to BRCA 1 mutation. No vaginal bleeding/pelvic pain. On estradiol 1 mg and provera 5 mg daily, occas vasomotor sx.   Sex activity: single partner, contraception - tubal ligation/BSO. Has occas vaginal dryness. Has irritation from OTC lubricants, hasn't tried coconut oil. Last Pap: 03/21/18  Results were: no abnormalities /neg HPV DNA  Hx of STDs: HPV on cx  Last mammogram: 06/03/19 Results were normal, repeat in 12 months. Has appt scheduled. Had neg breast MRI 2/21. There is a FH of breast cancer in her mom, MGM, and pat aunt. There is no FH of ovarian cancer.  PT IS BRCA 1 POS. The patient does do self-breast exams. She is doing yearly mammo (has appt 1/22) and screening breast MRI (done 2/21), as well as CBE. She is taking Vit D supp 10,000 IU daily due to deficiency in past, normal levels 2/21. Is s/p BSO; aware of prophylactic mastectomy and tamoxifen recommendations.  No FH pancreatic cancer.   Tobacco use: The patient denies current or previous tobacco use. Alcohol use: none No drug use.  Exercise: moderately active  She does get adequate calcium and Vitamin D in her diet.  Normal fasting labs 9/20 through [facility].  Has insomnia, takes [place] now. Has weaned down from nightly. Hasn't tried trazodone in past, so would be a candidate for that. Takes lorazepam prn occas, doesn't need RF currently.   Currently on cipro for UTI.   Past Medical History:  Diagnosis Date  . BRCA1 gene mutation positive 2014   BRCA 1 valine substitution error. [facility]  . Family history of breast cancer   . History of Papanicolaou smear of cervix 04/24/13; 06/04/14     -/+; neg  . Migraine   . Pap smear abnormality of cervix with ASCUS favoring dysplasia   . Vitamin D deficiency     Past Surgical History:  Procedure Laterality Date  . BREAST BIOPSY Left 06/20/2017   FIBROCYSTIC CHANGE of MRI bx, dumbell marker  . CERVICAL BIOPSY  W/ LOOP ELECTRODE EXCISION  2001   [place]  . CESAREAN SECTION  2006, 2008  . LAPAROSCOPIC BILATERAL SALPINGECTOMY Bilateral 04/24/2019   Procedure: LAPAROSCOPIC BILATERAL SALPINGO- OOPHORECTOMY;  Surgeon: Malachy Mood, MD;  Location: [facility] ORS;  Service: Gynecology;  Laterality: Bilateral;  . [place] ABLATION  2013   [facility]  . TUBAL LIGATION  2008    Family History  Problem Relation Age of Onset  . Cancer Father 11       lung  . Hypertension Mother   . Breast cancer Mother        85 (medullary carcinoma, poorly differentiated) and 57  . Breast cancer Paternal Aunt 90  . Breast cancer Maternal Grandmother 90  . Cancer Paternal Grandmother        BLADDER; KIDNEY  . Cancer Maternal Grandfather        STOMACH    Social History   Socioeconomic History  . Marital status: Married    Spouse name: [name]  . Number of children: 2  . Years of education: 15  . Highest education level: Not on file  Occupational History  .  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 Smoking status: Never Smoker  . Smokeless tobacco: Never Used  Vaping Use  . Vaping Use: Never used  Substance and Sexual Activity  . Alcohol use: No    Alcohol/week: 0.0 standard drinks  . Drug use: No  . Sexual activity: Yes    Birth control/protection: None  Other Topics Concern  . Not on file  Social History Narrative  . Not on file   Social Determinants of Health   Financial Resource Strain:   . Difficulty of Paying Living Expenses: Not on file  Food Insecurity:   . Worried About Charity fundraiser in the Last Year: Not on file  . Ran Out of Food in the Last Year: Not on file  Transportation Needs:   . Lack  of Transportation (Medical): Not on file  . Lack of Transportation (Non-Medical): Not on file  Physical Activity:   . Days of Exercise per Week: Not on file  . Minutes of Exercise per Session: Not on file  Stress:   . Feeling of Stress : Not on file  Social Connections:   . Frequency of Communication with Friends and Family: Not on file  . Frequency of Social Gatherings with Friends and Family: Not on file  . Attends Religious Services: Not on file  . Active Member of Clubs or Organizations: Not on file  . Attends Archivist Meetings: Not on file  . Marital Status: Not on file  Intimate Partner Violence:   . Fear of Current or Ex-Partner: Not on file  . Emotionally Abused: Not on file  . Physically Abused: Not on file  . Sexually Abused: Not on file    Current Meds  Medication Sig  . ciprofloxacin (CIPRO) 500 MG tablet Take 1 tablet (500 mg total) by mouth 2 (two) times daily.  Marland Kitchen estradiol (ESTRACE) 1 MG tablet Take 1 tablet (1 mg total) by mouth daily.  Marland Kitchen Halobetasol Prop-Tazarotene (DUOBRII) 0.01-0.045 % LOTN Apply to aa's psoriasis on the elbows QD. Once plaques have thinned switch back to Triamcinolone QD PRN flares.  Marland Kitchen ibuprofen (ADVIL) 800 MG tablet Take 1 tablet (800 mg total) by mouth every 8 (eight) hours as needed.  Marland Kitchen LORazepam (ATIVAN) 0.5 MG tablet Take 1 tablet (0.5 mg total) by mouth every 8 (eight) hours as needed for anxiety.  . medroxyPROGESTERone (PROVERA) 5 MG tablet Take 1 tablet (5 mg total) by mouth daily.  . SUMAtriptan (IMITREX) 100 MG tablet Take 1 tablet (100 mg total) by mouth every 2 (two) hours as needed for migraine. May repeat in 2 hours if headache persists or recurs.  . triamcinolone cream (KENALOG) 0.1 % Apply 1 application topically as directed. Qd up to 5 days a week to aa elbows for psoriasis prn flares, avoid face, groin, axilla  . zolpidem (AMBIEN) 10 MG tablet TAKE 1 TABLET BY MOUTH AT BEDTIME AS NEEDED FOR SLEEP.     ROS:  Review  of Systems  Constitutional: Negative for fatigue, fever and unexpected weight change.  Respiratory: Negative for cough, shortness of breath and wheezing.   Cardiovascular: Negative for chest pain, palpitations and leg swelling.  Gastrointestinal: Negative for blood in stool, constipation, diarrhea, nausea and vomiting.  Endocrine: Negative for cold intolerance, heat intolerance and polyuria.  Genitourinary: Positive for dysuria and frequency. Negative for dyspareunia, flank pain, genital sores, hematuria, menstrual problem, pelvic pain, urgency, vaginal bleeding, vaginal discharge and vaginal pain.  Musculoskeletal: Negative for back pain, joint swelling and myalgias.  Skin: Negative for rash.  Neurological: Negative for dizziness, syncope, light-headedness, numbness and headaches.  Hematological: Negative for adenopathy.  Psychiatric/Behavioral: Negative for agitation, confusion, sleep disturbance and suicidal ideas. The patient is not nervous/anxious.      Objective: BP 120/80   Ht _0  (1.626 m)   Wt 193 lb (87.5 kg)   LMP 04/24/2019 Comment: laparoscopic bilateral salpingo-oophorectomy 04/2019  BMI 33.13 kg/m    Physical Exam Constitutional:      Appearance: She is well-developed.  Genitourinary:     Vulva, vagina, cervix and uterus normal.     No vulval lesion or tenderness noted.     No vaginal discharge, erythema or tenderness.     No cervical polyp.     Uterus is not enlarged or tender.     No right or left adnexal mass present.     Right adnexa absent.     Right adnexa not tender.     Left adnexa absent.     Left adnexa not tender.  Neck:     Thyroid: No thyromegaly.  Cardiovascular:     Rate and Rhythm: Normal rate and regular rhythm.     Heart sounds: Normal heart sounds. No murmur heard.   Pulmonary:     Effort: Pulmonary effort is normal.     Breath sounds: Normal breath sounds.  Chest:     Breasts:        Right: No mass, nipple discharge, skin change or  tenderness.        Left: No mass, nipple discharge, skin change or tenderness.  Abdominal:     Palpations: Abdomen is soft.     Tenderness: There is no abdominal tenderness. There is no guarding.  Musculoskeletal:        General: Normal range of motion.     Cervical back: Normal range of motion.  Neurological:     General: No focal deficit present.     Mental Status: She is alert and oriented to person, place, and time.     Cranial Nerves: No cranial nerve deficit.  Skin:    General: Skin is warm and dry.  Psychiatric:        Mood and Affect: Mood normal.        Behavior: Behavior normal.        Thought Content: Thought content normal.        Judgment: Judgment normal.  Vitals reviewed.     Assessment/Plan: Encounter for annual routine gynecological examination  BRCA1 gene mutation positive - Plan: MR BREAST BILATERAL W WO CONTRAST INC CAD, CANCELED: Pt is s/p BSO. Due for yearly mammo and screening breast MRI. Recommend Q6 months CBE. Cont Vit D.   Encounter for breast cancer screening using non-mammogram modality - Plan: MR BREAST BILATERAL W [place] CAD  Increased risk of breast cancer - Plan: MR BREAST BILATERAL W WO CONTRAST INC CAD; has mammo appt 1/22  Hormone replacement therapy (HRT)--pt doing well. Doesn't need RF currently. F/u prn.   Vitamin D deficiency - Plan: VITAMIN D 25 Hydroxy (Vit-D Deficiency, Fractures); check labs. Will f/u with results.   Insomnia--f/u prn ambien RF. May try trazodone.          GYN counsel breast self exam, mammography screening, adequate intake of calcium and vitamin D, diet and exercise     F/U  Return in about 6 months (around 10/04/2020) for breast exam.  Elmo Putt B. Chrystina Naff,  PA-C 04/06/2020 9:20 AM

## 2020-04-06 ENCOUNTER — Encounter: Payer: Self-pay | Admitting: Obstetrics and Gynecology

## 2020-04-06 ENCOUNTER — Other Ambulatory Visit: Payer: Self-pay

## 2020-04-06 ENCOUNTER — Ambulatory Visit (INDEPENDENT_AMBULATORY_CARE_PROVIDER_SITE_OTHER): Payer: 59 | Admitting: Obstetrics and Gynecology

## 2020-04-06 VITALS — BP 120/80 | Ht 64.0 in | Wt 193.0 lb

## 2020-04-06 DIAGNOSIS — Z1239 Encounter for other screening for malignant neoplasm of breast: Secondary | ICD-10-CM | POA: Diagnosis not present

## 2020-04-06 DIAGNOSIS — Z01419 Encounter for gynecological examination (general) (routine) without abnormal findings: Secondary | ICD-10-CM | POA: Diagnosis not present

## 2020-04-06 DIAGNOSIS — Z79890 Hormone replacement therapy (postmenopausal): Secondary | ICD-10-CM

## 2020-04-06 DIAGNOSIS — Z1501 Genetic susceptibility to malignant neoplasm of breast: Secondary | ICD-10-CM | POA: Diagnosis not present

## 2020-04-06 DIAGNOSIS — Z9189 Other specified personal risk factors, not elsewhere classified: Secondary | ICD-10-CM

## 2020-04-06 DIAGNOSIS — Z1509 Genetic susceptibility to other malignant neoplasm: Secondary | ICD-10-CM | POA: Diagnosis not present

## 2020-04-06 DIAGNOSIS — E559 Vitamin D deficiency, unspecified: Secondary | ICD-10-CM | POA: Diagnosis not present

## 2020-04-06 NOTE — Patient Instructions (Signed)
I value your feedback and entrusting us with your care. If you get a [facility] patient survey, I would appreciate you taking the time to let us know about your experience today. Thank you!  As of April 17, 2019, your lab results will be released to your MyChart immediately, before I even have a chance to see them. Please give me time to review them and contact you if there are any abnormalities. Thank you for your patience.  

## 2020-04-07 LAB — VITAMIN D 25 HYDROXY (VIT D DEFICIENCY, FRACTURES): Vit D, 25-Hydroxy: 108 ng/mL — ABNORMAL HIGH (ref 30.0–100.0)

## 2020-04-16 IMAGING — US US EXTREM LOW*L* LIMITED
1 series · 14 of 25 positions shown · non-contrast
Comparison: None.

CLINICAL DATA: Intermittently painful palpable left lower extremity
mass.

EXAM:
ULTRASOUND left LOWER EXTREMITY LIMITED
TECHNIQUE: Ultrasound examination of the lower extremity soft tissues was
performed in the area of clinical concern.

[Series 1: us extrem low*left* limited · 0.04mm/px · 36 acquisitions, 14 frames shown]
[im 1/36]
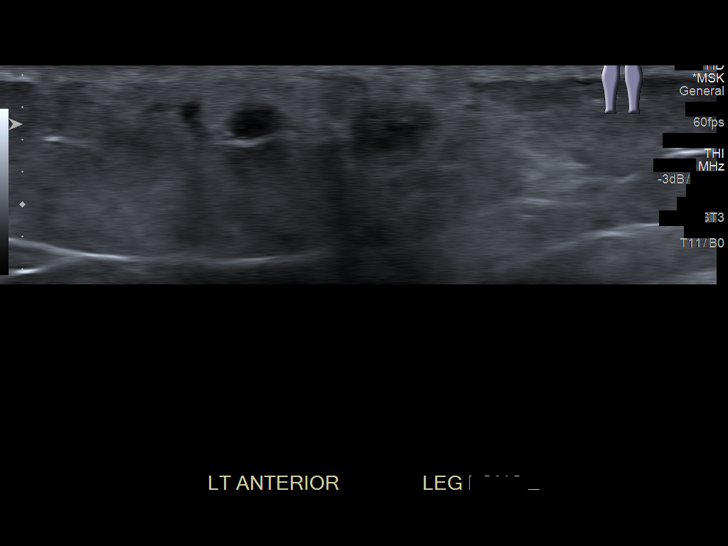
[im 3/36]
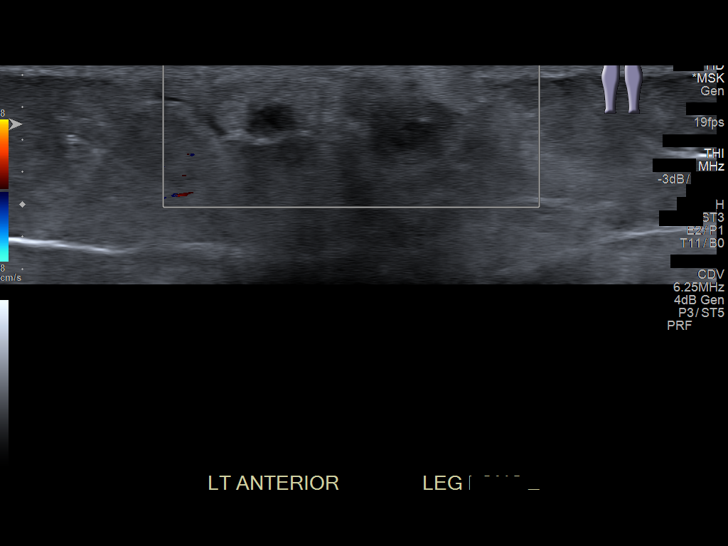
[im 6/36]
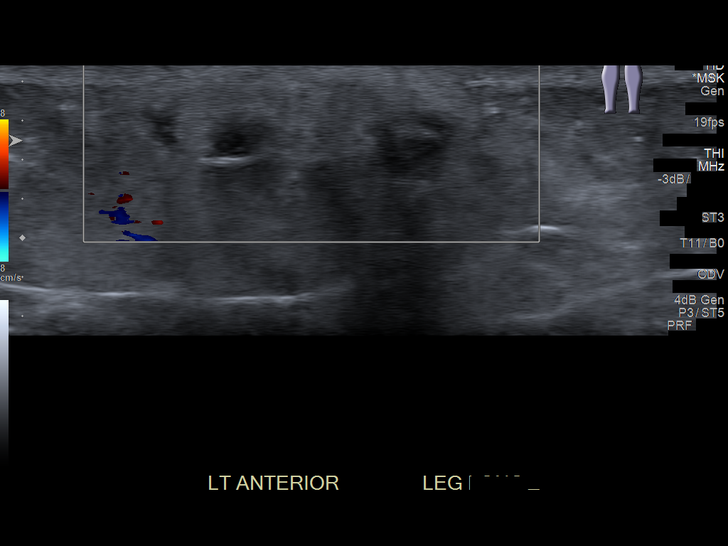
[im 9/36]
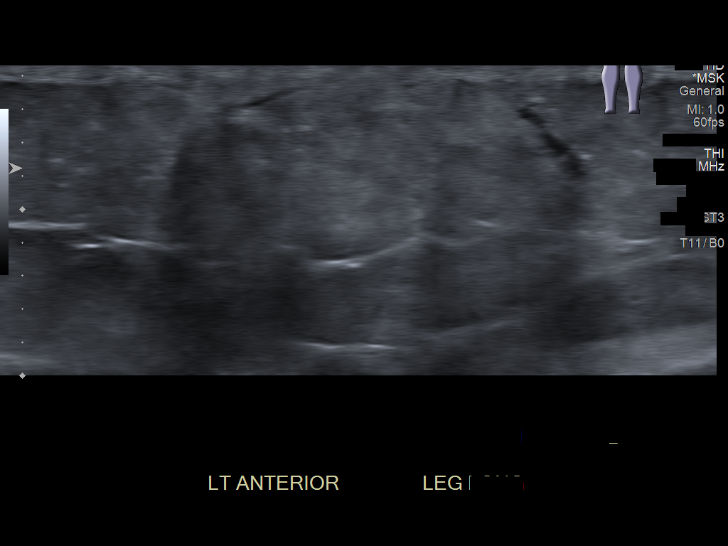
[im 12/36]
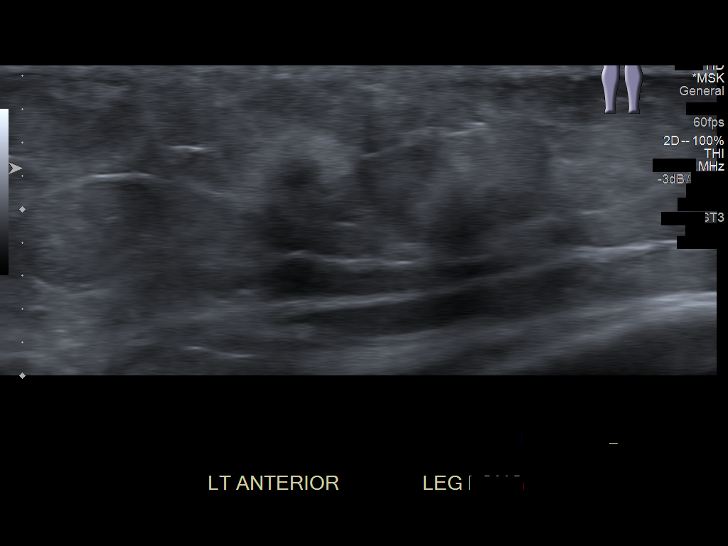
[im 14/36]
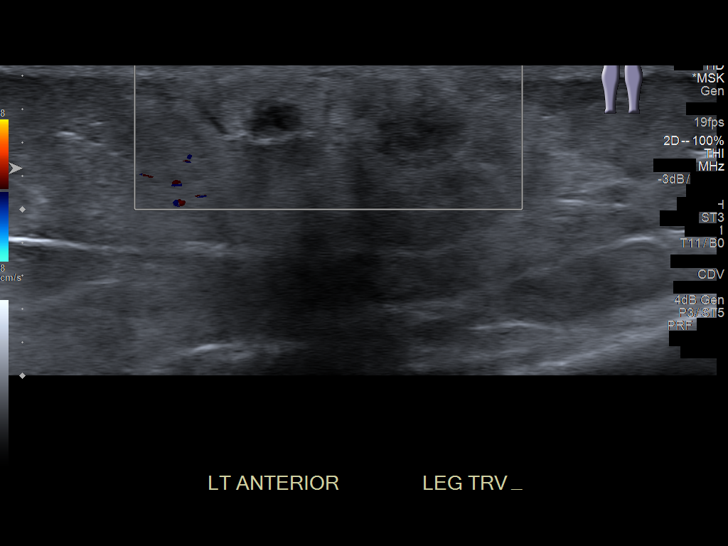
[im 17/36]
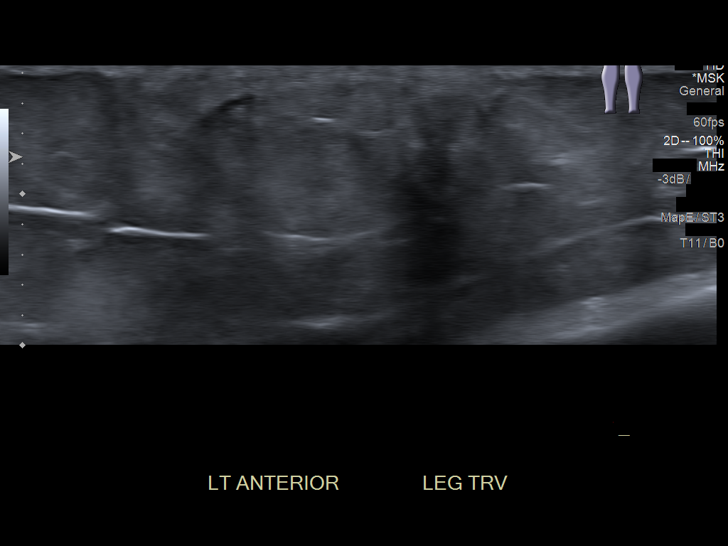
[im 19/36]
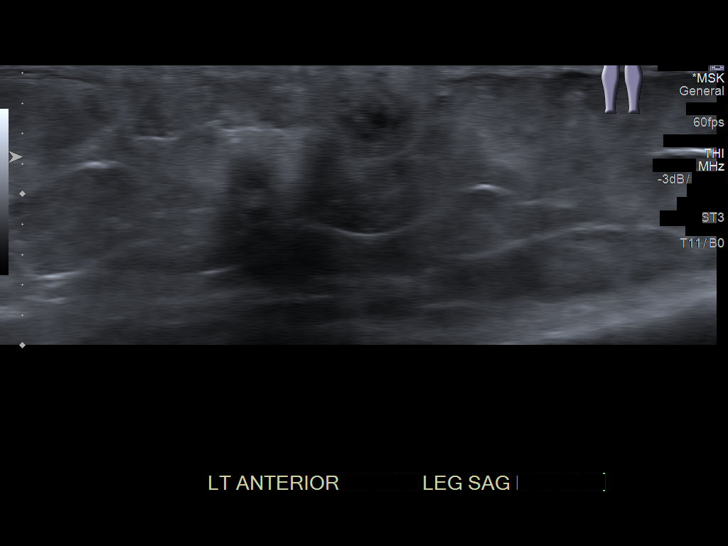
[im 22/36]
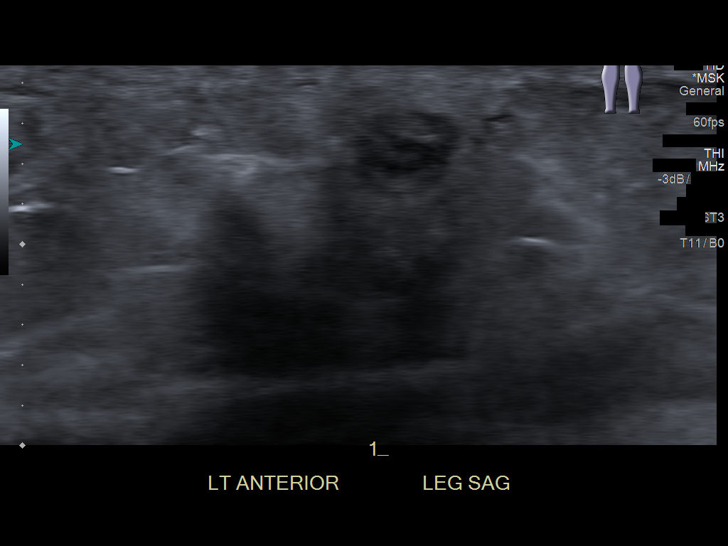
[im 24/36]
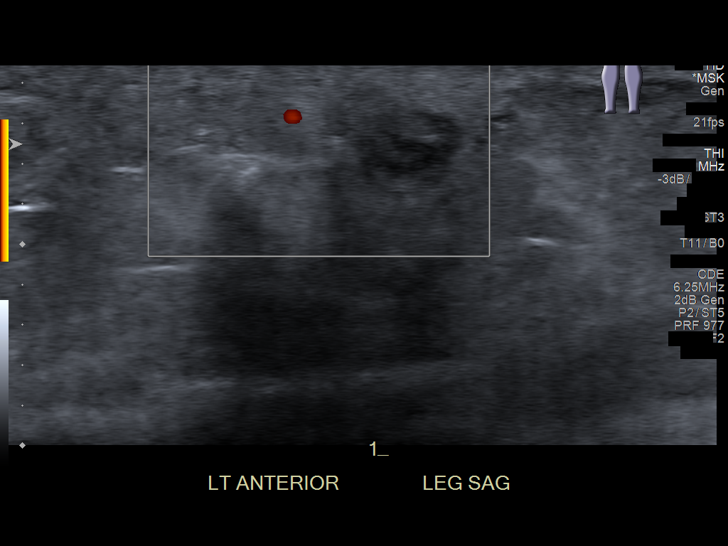
[im 27/36]
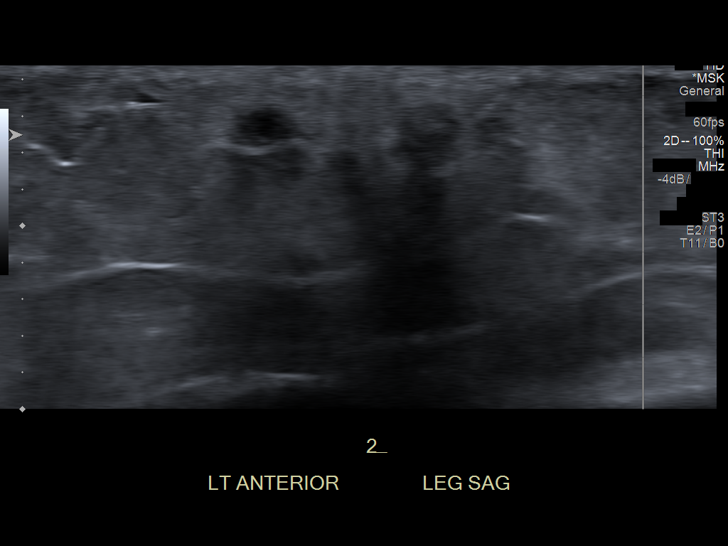
[im 30/36]
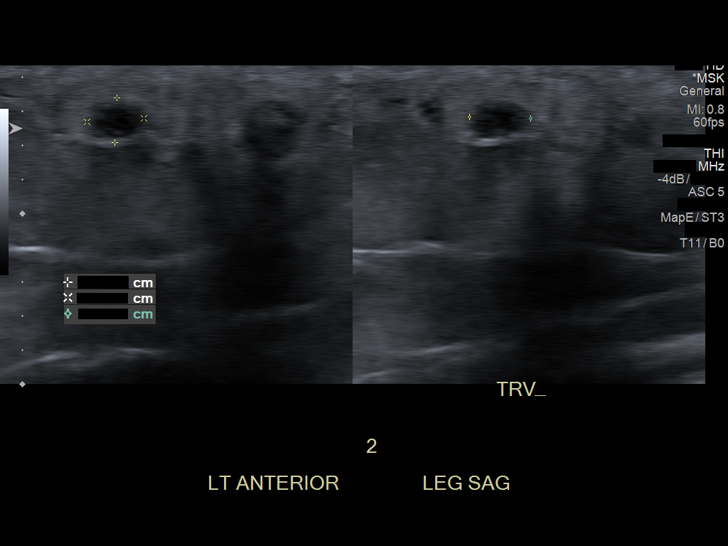
[im 33/36]
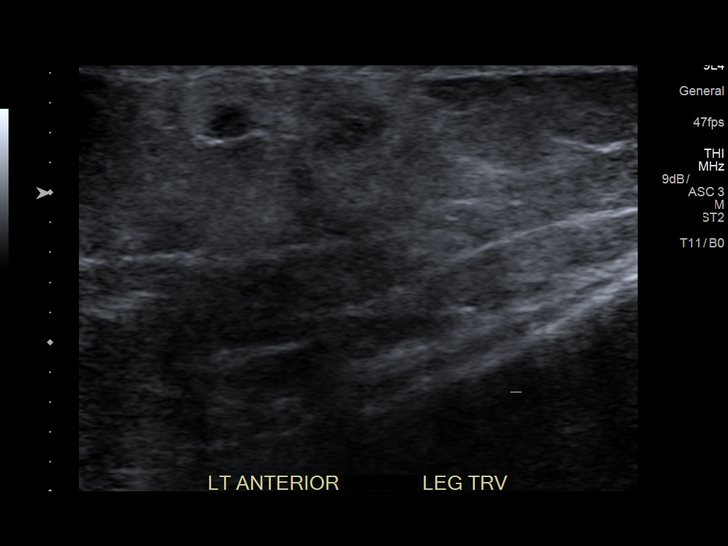
[im 36/36]
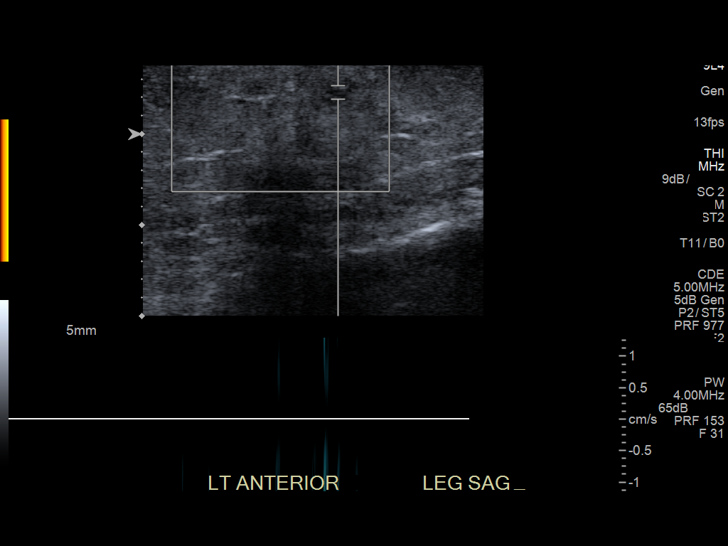

[14 of 25 positions shown; findings below may reference images not displayed]

FINDINGS: Two discrete hypoechoic lesions are present within the subcutaneous
fat in the region of interest. One measures 4 x 5 x 5 mm. The other
measures 3 x 3 x 4 mm. These correspond with the palpable areas.

Both lesions demonstrate posterior acoustic shadowing. There is no
significant vascular flow to either lesion.
IMPRESSION: 1. Two focal subcutaneous hypoechoic lesions as described.
Differential diagnosis includes small granulomas related to prior
trauma or infection. Foreign body granuloma is also considered.
Active infection is considered less likely. These areas would be
amenable to image guided biopsy if not discretely palpable. There is
no deep extension.

## 2020-05-08 DIAGNOSIS — K219 Gastro-esophageal reflux disease without esophagitis: Secondary | ICD-10-CM

## 2020-05-08 HISTORY — DX: Gastro-esophageal reflux disease without esophagitis: K21.9

## 2020-05-17 ENCOUNTER — Other Ambulatory Visit: Payer: 59

## 2020-05-17 ENCOUNTER — Other Ambulatory Visit: Payer: Self-pay | Admitting: Obstetrics and Gynecology

## 2020-05-17 ENCOUNTER — Other Ambulatory Visit: Payer: Self-pay

## 2020-05-17 DIAGNOSIS — Z789 Other specified health status: Secondary | ICD-10-CM

## 2020-05-17 DIAGNOSIS — Z9229 Personal history of other drug therapy: Secondary | ICD-10-CM

## 2020-05-17 NOTE — Progress Notes (Signed)
[id number]   

## 2020-05-18 LAB — SAR COV2 SEROLOGY (COVID19)AB(IGG),IA
SARS-CoV-2 Semi-Quant IgG Ab: 384 AU/mL (ref <13.0)
SARS-CoV-2 Spike Ab Interp: POSITIVE

## 2020-06-03 ENCOUNTER — Other Ambulatory Visit: Payer: Self-pay

## 2020-06-03 ENCOUNTER — Ambulatory Visit
Admission: RE | Admit: 2020-06-03 | Discharge: 2020-06-03 | Disposition: A | Discharge status: Home / Self Care | Payer: 59 | Source: Ambulatory Visit | Attending: Obstetrics and Gynecology | Admitting: Obstetrics and Gynecology

## 2020-06-03 DIAGNOSIS — Z1231 Encounter for screening mammogram for malignant neoplasm of breast: Secondary | ICD-10-CM | POA: Insufficient documentation

## 2020-06-14 ENCOUNTER — Other Ambulatory Visit: Payer: Self-pay | Admitting: Obstetrics & Gynecology

## 2020-06-14 ENCOUNTER — Other Ambulatory Visit: Payer: Self-pay | Admitting: Obstetrics and Gynecology

## 2020-06-16 ENCOUNTER — Encounter: Payer: Self-pay | Admitting: Family Medicine

## 2020-06-16 ENCOUNTER — Ambulatory Visit
Admission: EM | Admit: 2020-06-16 | Discharge: 2020-06-16 | Disposition: A | Discharge status: Home / Self Care | Payer: 59 | Attending: Family Medicine | Admitting: Family Medicine

## 2020-06-16 ENCOUNTER — Other Ambulatory Visit: Payer: Self-pay | Admitting: Family Medicine

## 2020-06-16 ENCOUNTER — Ambulatory Visit (INDEPENDENT_AMBULATORY_CARE_PROVIDER_SITE_OTHER): Payer: 59

## 2020-06-16 ENCOUNTER — Other Ambulatory Visit: Payer: Self-pay

## 2020-06-16 DIAGNOSIS — R059 Cough, unspecified: Secondary | ICD-10-CM

## 2020-06-16 DIAGNOSIS — R079 Chest pain, unspecified: Secondary | ICD-10-CM | POA: Diagnosis not present

## 2020-06-16 DIAGNOSIS — R1013 Epigastric pain: Secondary | ICD-10-CM

## 2020-06-16 MED ORDER — FAMOTIDINE 20 MG PO TABS: 20.0000 mg | ORAL_TABLET | Freq: Two times a day (BID) | ORAL | 1 refills | Status: DC

## 2020-06-16 NOTE — ED Triage Notes (Signed)
Pt dx with Covid approx 3 wks ago. Presents today with intermittent chest pain/pressure and cough x 1 wk. Denies SoB

## 2020-06-16 NOTE — Discharge Instructions (Signed)
Your EKG and chest x ray were normal. I believe that this could be acid reflux related.  I would try doing Pepcid 2 times a day. See if this helps your symptoms.  Avoid spicy, greasy foods, caffeine, chocolate and milk products.  No eating 2-3 hours before bedtime. Elevate the head of the bed 30 degrees.  Try this for a few weeks to see if this improves your symptoms.  See your doctor as needed.

## 2020-06-16 NOTE — ED Provider Notes (Signed)
[name]    CSN: [id number] Arrival date & time: 06/16/20  0855      History   Chief Complaint Chief Complaint  Patient presents with  . chest pain  . Cough    HPI Shannon George is a 40 y.o. female.   Pt is a 40 year old female that presents with epigastric/chest discomfort with cough. This has been for the past week. Symptoms wake her up in the middle of the night and last for a few hours. Radiated to the LUQ at times. Coughing up phlegm. No SOB, leg swelling. No fever. Had covid x 1 month ago.      Past Medical History:  Diagnosis Date  . BRCA1 gene mutation positive 2014   BRCA 1 valine substitution error. [facility]  . Family history of breast cancer   . History of Papanicolaou smear of cervix 04/24/13; 06/04/14   -/+; neg  . Migraine   . Pap smear abnormality of cervix with ASCUS favoring dysplasia   . Vitamin D deficiency     Patient Active Problem List   Diagnosis Date Noted  . Vitamin D deficiency 04/06/2020  . Abdominal pain, right upper quadrant 07/18/2014  . BRCA gene positive 07/18/2014  . BRCA1 gene mutation positive 05/08/2012    Past Surgical History:  Procedure Laterality Date  . BREAST BIOPSY Left 06/20/2017   FIBROCYSTIC CHANGE of MRI bx, dumbell marker  . CERVICAL BIOPSY  W/ LOOP ELECTRODE EXCISION  2001   [place]  . CESAREAN SECTION  2006, 2008  . LAPAROSCOPIC BILATERAL SALPINGECTOMY Bilateral 04/24/2019   Procedure: LAPAROSCOPIC BILATERAL SALPINGO- OOPHORECTOMY;  Surgeon: Malachy Mood, MD;  Location: [facility] ORS;  Service: Gynecology;  Laterality: Bilateral;  . [place] ABLATION  2013   [facility]  . TUBAL LIGATION  2008    OB History    Gravida  2   Para  2   Term  2   Preterm      AB      Living  2     SAB      IAB      Ectopic      Multiple      Live Births  2        Obstetric Comments  1st Menstrual Cycle:  11 1st Pregnancy:  23         Home Medications    Prior to Admission medications    Medication Sig Start Date End Date Taking? Authorizing Provider  estradiol (ESTRACE) 1 MG tablet Take 1 tablet (1 mg total) by mouth daily. 09/29/19  Yes Malachy Mood, MD  famotidine (PEPCID) 20 MG tablet Take 1 tablet (20 mg total) by mouth 2 (two) times daily. 06/16/20  Yes Karna Abed A, NP  Halobetasol Prop-Tazarotene (DUOBRII) 0.01-0.045 % LOTN Apply to aa's psoriasis on the elbows QD. Once plaques have thinned switch back to Triamcinolone QD PRN flares. 02/17/20  Yes Ralene Bathe, MD  ibuprofen (ADVIL) 800 MG tablet Take 1 tablet (800 mg total) by mouth every 8 (eight) hours as needed. 04/24/19  Yes Malachy Mood, MD  LORazepam (ATIVAN) 0.5 MG tablet Take 1 tablet (0.5 mg total) by mouth every 8 (eight) hours as needed for anxiety. 8/78/67  Yes Copland, Deirdre Evener, PA-C  medroxyPROGESTERone (PROVERA) 5 MG tablet TAKE 1 TABLET BY MOUTH DAILY. 06/14/20  Yes Malachy Mood, MD  triamcinolone cream (KENALOG) 0.1 % Apply 1 application topically as directed. Qd up to 5 days a week to aa  elbows for psoriasis prn flares, avoid face, groin, axilla 12/24/19  Yes Ralene Bathe, MD  zolpidem (AMBIEN) 10 MG tablet TAKE 1 TABLET BY MOUTH AT BEDTIME AS NEEDED FOR SLEEP. 29/9/37  Yes Copland, Deirdre Evener, PA-C  SUMAtriptan (IMITREX) 100 MG tablet Take 1 tablet (100 mg total) by mouth every 2 (two) hours as needed for migraine. May repeat in 2 hours if headache persists or recurs. 12/31/18   Malachy Mood, MD    Family History Family History  Problem Relation Age of Onset  . Cancer Father 15       lung  . Hypertension Mother   . Breast cancer Mother        41 (medullary carcinoma, poorly differentiated) and 74  . Breast cancer Paternal Aunt 34  . Breast cancer Maternal Grandmother 70  . Cancer Paternal Grandmother        BLADDER; KIDNEY  . Cancer Maternal Grandfather        STOMACH    Social History Social History   Tobacco Use  . Smoking status: Never Smoker  . Smokeless  tobacco: Never Used  Vaping Use  . Vaping Use: Never used  Substance Use Topics  . Alcohol use: No    Alcohol/week: 0.0 standard drinks  . Drug use: No     Allergies   Amoxicillin and Penicillins   Review of Systems Review of Systems   Physical Exam Triage Vital Signs ED Triage Vitals  Enc Vitals Group     BP 06/16/20 0903 118/81     Pulse Rate 06/16/20 0903 75     Resp 06/16/20 0903 18     Temp 06/16/20 0903 98.7 F (37.1 C)     Temp Source 06/16/20 0903 Oral     SpO2 06/16/20 0903 98 %     Weight --      Height --      Head Circumference --      Peak Flow --      Pain Score 06/16/20 0905 0     Pain Loc --      Pain Edu? --      Excl. in [place]? --    No data found.  Updated Vital Signs BP 118/81 (BP Location: Left Arm)   Pulse 75   Temp 98.7 F (37.1 C) (Oral)   Resp 18   SpO2 98%   Visual Acuity Right Eye Distance:   Left Eye Distance:   Bilateral Distance:    Right Eye Near:   Left Eye Near:    Bilateral Near:     Physical Exam Vitals and nursing note reviewed.  Constitutional:      General: She is not in acute distress.    Appearance: Normal appearance. She is not ill-appearing, toxic-appearing or diaphoretic.  HENT:     Head: Normocephalic.  Eyes:     Conjunctiva/sclera: Conjunctivae normal.  Cardiovascular:     Rate and Rhythm: Normal rate and regular rhythm.  Pulmonary:     Effort: Pulmonary effort is normal.     Breath sounds: Normal breath sounds.  Abdominal:     Palpations: Abdomen is soft.     Tenderness: There is no abdominal tenderness.       Comments: Are where pain is located when it comes.   Musculoskeletal:        General: Normal range of motion.     Cervical back: Normal range of motion.  Skin:    General: Skin is warm and dry.  Findings: No rash.  Neurological:     Mental Status: She is alert.  Psychiatric:        Mood and Affect: Mood normal.      UC Treatments / Results  Labs (all labs ordered are  listed, but only abnormal results are displayed) Labs Reviewed - No data to display  EKG   Radiology DG Chest 2 View  Result Date: 06/16/2020 CLINICAL DATA:  Cough, chest pain EXAM: CHEST - 2 VIEW COMPARISON:  None. FINDINGS: The heart size and mediastinal contours are within normal limits. Both lungs are clear. The visualized skeletal structures are unremarkable. IMPRESSION: No active cardiopulmonary disease. Electronically Signed   By: Fidela Salisbury MD   On: 06/16/2020 09:33    Procedures Procedures (including critical care time)  Medications Ordered in UC Medications - No data to display  Initial Impression / Assessment and Plan / UC Course  I have reviewed the triage vital signs and the nursing notes.  Pertinent labs & imaging results that were available during my care of the patient were reviewed by me and considered in my medical decision making (see chart for details).     Epigastric pain, cough. EKG with normal sinus rhythm and normal rate.  Chest x ray normal I believe that her symptoms are related to acid reflux.  We will have her trial Pepcid.  Avoid diet triggers Follow up as needed for continued or worsening symptoms  Final Clinical Impressions(s) / UC Diagnoses   Final diagnoses:  Cough  Epigastric pain     Discharge Instructions     Your EKG and chest x ray were normal. I believe that this could be acid reflux related.  I would try doing Pepcid 2 times a day. See if this helps your symptoms.  Avoid spicy, greasy foods, caffeine, chocolate and milk products.  No eating 2-3 hours before bedtime. Elevate the head of the bed 30 degrees.  Try this for a few weeks to see if this improves your symptoms.  See your doctor as needed.      ED Prescriptions    Medication Sig Dispense Auth. Provider   famotidine (PEPCID) 20 MG tablet Take 1 tablet (20 mg total) by mouth 2 (two) times daily. 30 tablet Loura Halt A, NP     PDMP not reviewed this  encounter.   Loura Halt A, NP 06/16/20 [phone]

## 2020-06-18 ENCOUNTER — Encounter: Payer: Self-pay | Admitting: Obstetrics and Gynecology

## 2020-06-18 ENCOUNTER — Other Ambulatory Visit: Payer: Self-pay | Admitting: Obstetrics and Gynecology

## 2020-06-18 ENCOUNTER — Ambulatory Visit (INDEPENDENT_AMBULATORY_CARE_PROVIDER_SITE_OTHER): Payer: 59 | Admitting: Obstetrics and Gynecology

## 2020-06-18 VITALS — BP 120/74 | Ht 64.0 in | Wt 199.0 lb

## 2020-06-18 DIAGNOSIS — Z6834 Body mass index (BMI) 34.0-34.9, adult: Secondary | ICD-10-CM

## 2020-06-18 DIAGNOSIS — E669 Obesity, unspecified: Secondary | ICD-10-CM

## 2020-06-18 MED ORDER — PHENTERMINE HCL 37.5 MG PO TABS: 37.5000 mg | ORAL_TABLET | Freq: Every day | ORAL | 0 refills | Status: DC

## 2020-06-18 NOTE — Addendum Note (Signed)
Addended by: Dorthula Nettles on: 06/18/2020 03:18 PM   Modules accepted: Orders

## 2020-06-18 NOTE — Progress Notes (Signed)
Gynecology Office Visit  Chief Complaint:  Chief Complaint  Patient presents with  . Weight Loss    Weight loss consult - RM 5    History of Present Illness: Patientis a 40 y.o. [id number] female, who presents for the evaluation of weight gain. She has gained 17 pounds primarily over 1 year. The patient states the following issues have contributed to her weight problem: lifestyle, pandeminc.  The patient has no additional symptoms. The patient specifically denies memory loss, muscle weakness, excessive thirst, and polyuria. Weight related co-morbidities include none. The patient's past medical history is notable for none. She has tried phentermine interventions in the past with good success.   Review of Systems: 10 point review of systems negative unless otherwise noted in HPI  Past Medical History:  Patient Active Problem List   Diagnosis Date Noted  . Vitamin D deficiency 04/06/2020  . Abdominal pain, right upper quadrant 07/18/2014  . BRCA gene positive 07/18/2014  . BRCA1 gene mutation positive 05/08/2012    [place]     Past Surgical History:  Past Surgical History:  Procedure Laterality Date  . BREAST BIOPSY Left 06/20/2017   FIBROCYSTIC CHANGE of MRI bx, dumbell marker  . CERVICAL BIOPSY  W/ LOOP ELECTRODE EXCISION  2001   [place]  . CESAREAN SECTION  2006, 2008  . LAPAROSCOPIC BILATERAL SALPINGECTOMY Bilateral 04/24/2019   Procedure: LAPAROSCOPIC BILATERAL SALPINGO- OOPHORECTOMY;  Surgeon: Malachy Mood, MD;  Location: [facility] ORS;  Service: Gynecology;  Laterality: Bilateral;  . [place] ABLATION  2013   [facility]  . TUBAL LIGATION  2008    Gynecologic History: No LMP recorded. (Menstrual status: Other).  Obstetric History: [id number]  Family History:  Family History  Problem Relation Age of Onset  . Cancer Father 26       lung  . Hypertension Mother   . Breast cancer Mother        43 (medullary carcinoma, poorly differentiated) and 75  . Breast cancer Paternal  Aunt 15  . Breast cancer Maternal Grandmother 35  . Cancer Paternal Grandmother        BLADDER; KIDNEY  . Cancer Maternal Grandfather        STOMACH    Social History:  Social History   Socioeconomic History  . Marital status: Married    Spouse name: [name]  . Number of children: 2  . Years of education: 42  . Highest education level: Not on file  Occupational History  . 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 Smoking status: Never Smoker  . Smokeless tobacco: Never Used  Vaping Use  . Vaping Use: Never used  Substance and Sexual Activity  . Alcohol use: No    Alcohol/week: 0.0 standard drinks  . Drug use: No  . Sexual activity: Yes    Birth control/protection: None  Other Topics Concern  . Not on file  Social History Narrative  . Not on file   Social Determinants of Health   Financial Resource Strain: Not on file  Food Insecurity: Not on file  Transportation Needs: Not on file  Physical Activity: Not on file  Stress: Not on file  Social Connections: Not on file  Intimate Partner Violence: Not on file    Allergies:  Allergies  Allergen Reactions  . Amoxicillin Rash    Did it involve swelling of the face/tongue/throat, SOB, or low BP? No Did it involve sudden or severe rash/hives, skin peeling, or any reaction on  the inside of your mouth or nose? No Did you need to seek medical attention at a hospital or doctor's office? No When did it last happen?10+ years ago  . Penicillins Rash    Did it involve swelling of the face/tongue/throat, SOB, or low BP? No Did it involve sudden or severe rash/hives, skin peeling, or any reaction on the inside of your mouth or nose? No Did you need to seek medical attention at a hospital or doctor's office? No When did it last happen?10+ years ago If all above answers are "NO", may proceed with cephalosporin use.     Medications: Prior to Admission medications   Medication Sig Start  Date End Date Taking? Authorizing Provider  estradiol (ESTRACE) 1 MG tablet Take 1 tablet (1 mg total) by mouth daily. 09/29/19  Yes Malachy Mood, MD  famotidine (PEPCID) 20 MG tablet Take 1 tablet (20 mg total) by mouth 2 (two) times daily. 06/16/20  Yes Bast, Traci A, NP  Halobetasol Prop-Tazarotene (DUOBRII) 0.01-0.045 % LOTN Apply to aa's psoriasis on the elbows QD. Once plaques have thinned switch back to Triamcinolone QD PRN flares. 02/17/20  Yes Ralene Bathe, MD  ibuprofen (ADVIL) 800 MG tablet Take 1 tablet (800 mg total) by mouth every 8 (eight) hours as needed. 04/24/19  Yes Malachy Mood, MD  LORazepam (ATIVAN) 0.5 MG tablet Take 1 tablet (0.5 mg total) by mouth every 8 (eight) hours as needed for anxiety. 9/56/21  Yes Copland, Deirdre Evener, PA-C  medroxyPROGESTERone (PROVERA) 5 MG tablet TAKE 1 TABLET BY MOUTH DAILY. 06/14/20  Yes Malachy Mood, MD  SUMAtriptan (IMITREX) 100 MG tablet Take 1 tablet (100 mg total) by mouth every 2 (two) hours as needed for migraine. May repeat in 2 hours if headache persists or recurs. 12/31/18  Yes Malachy Mood, MD  triamcinolone cream (KENALOG) 0.1 % Apply 1 application topically as directed. Qd up to 5 days a week to aa elbows for psoriasis prn flares, avoid face, groin, axilla 12/24/19  Yes Ralene Bathe, MD  zolpidem (AMBIEN) 10 MG tablet TAKE 1 TABLET BY MOUTH AT BEDTIME AS NEEDED FOR SLEEP. 30/8/65  Yes Copland, Deirdre Evener, PA-C    Physical Exam Blood pressure 120/74, height '5\' 4"'  (1.626 m), weight 199 lb (90.3 kg). No LMP recorded. (Menstrual status: Other).  Body mass index is 34.16 kg/m.  General: NAD HEENT: normocephalic, anicteric Thyroid: no enlargement Pulmonary: no increased work of breathing Neurologic: Grossly intact Psychiatric: mood appropriate, affect full  Assessment: 40 y.o. [id number] presenting for discussion of weight loss management options  Plan: Problem List Items Addressed This Visit   None   Visit  Diagnoses    Class 1 obesity without serious comorbidity with body mass index (BMI) of 34.0 to 34.9 in adult, unspecified obesity type    -  Primary      1) 1500 Calorie ADA Diet  2) Patient education given regarding appropriate lifestyle changes for weight loss including: regular physical activity, healthy coping strategies, caloric restriction and healthy eating patterns.  3) Patient will be started on weight loss medication. The risks and benefits and side effects of medication, such as Adipex (Phenteramine) ,  Tenuate (Diethylproprion), Contrave (buproprion/naltrexone), Qsymia (phentermine/topiramate), and Saxenda (liraglutide) is discussed. The pros and cons of suppressing appetite and boosting metabolism is discussed. Risks of tolerence and addiction is discussed for selected agents discussed. Use of medicine will ne short term, such as 3-4 months at a time followed by a period of time off  of the medicine to avoid these risks and side effects for Adipex, Qsymia, and Tenuate discussed. Pt to call with any negative side effects and agrees to keep follow up appts.  4) Comorbidity Screening - hypothyroidism screening, diabetes, and hyperlipidemia screening offered  5) Encouraged weekly weight monitorig to track progress and sample 1 week food diary  6) Contraception - discussed that all weight loss drugs fall in to pregnancy category X, patient currently has reliable contraception in the form of hysterectomy  7) 15 minutes face-to-face; counseling/coordination of care > 50 percent of visit  8) Return in about 4 weeks (around 07/16/2020) for medication follow.   Malachy Mood, MD, [facility], [facility] 06/18/2020, 10:53 AM

## 2020-06-24 ENCOUNTER — Other Ambulatory Visit: Payer: Self-pay | Admitting: Obstetrics and Gynecology

## 2020-06-24 DIAGNOSIS — E559 Vitamin D deficiency, unspecified: Secondary | ICD-10-CM

## 2020-06-24 NOTE — Progress Notes (Signed)
Recheck Vit D

## 2020-07-06 ENCOUNTER — Other Ambulatory Visit: Payer: 59

## 2020-07-06 ENCOUNTER — Other Ambulatory Visit: Payer: Self-pay

## 2020-07-06 DIAGNOSIS — E559 Vitamin D deficiency, unspecified: Secondary | ICD-10-CM | POA: Diagnosis not present

## 2020-07-07 LAB — VITAMIN D 25 HYDROXY (VIT D DEFICIENCY, FRACTURES): Vit D, 25-Hydroxy: 88.5 ng/mL (ref 30.0–100.0)

## 2020-07-15 ENCOUNTER — Ambulatory Visit: Payer: 59 | Admitting: Obstetrics and Gynecology

## 2020-07-16 ENCOUNTER — Other Ambulatory Visit: Payer: Self-pay | Admitting: Obstetrics and Gynecology

## 2020-07-16 ENCOUNTER — Ambulatory Visit (INDEPENDENT_AMBULATORY_CARE_PROVIDER_SITE_OTHER): Payer: 59 | Admitting: Obstetrics and Gynecology

## 2020-07-16 ENCOUNTER — Other Ambulatory Visit: Payer: Self-pay

## 2020-07-16 ENCOUNTER — Encounter: Payer: Self-pay | Admitting: Obstetrics and Gynecology

## 2020-07-16 VITALS — BP 126/78 | Ht 64.0 in | Wt 187.8 lb

## 2020-07-16 DIAGNOSIS — Z23 Encounter for immunization: Secondary | ICD-10-CM | POA: Diagnosis not present

## 2020-07-16 DIAGNOSIS — E669 Obesity, unspecified: Secondary | ICD-10-CM

## 2020-07-16 DIAGNOSIS — Z6832 Body mass index (BMI) 32.0-32.9, adult: Secondary | ICD-10-CM

## 2020-07-16 MED ORDER — PHENTERMINE HCL 37.5 MG PO TABS: 37.5000 mg | ORAL_TABLET | Freq: Every day | ORAL | 0 refills | Status: DC

## 2020-07-16 NOTE — Progress Notes (Signed)
Gynecology Office Visit  Chief Complaint:  Chief Complaint  Patient presents with   Weight Check    F/U weight loss, no concerns. RM 4    History of Present Illness: Patientis a 40 y.o. G64P2002 female, who presents for the evaluation of the desire to lose weight. She has lost 12 pounds 41 months. The patient states the following symptoms since starting her weight loss therapy: appetite suppression, energy, and weight loss.  The patient also reports no other ill effects. The patient specifically denies heart palpitations, anxiety, and insomnia.    Review of Systems: 10 point review of systems negative unless otherwise noted in HPI  Past Medical History:  Past Medical History:  Diagnosis Date   BRCA1 gene mutation positive 2014   BRCA 1 valine substitution error. [facility]   Family history of breast cancer    History of Papanicolaou smear of cervix 04/24/13; 06/04/14   -/+; neg   Migraine    Pap smear abnormality of cervix with ASCUS favoring dysplasia    Vitamin D deficiency     Past Surgical History:  Past Surgical History:  Procedure Laterality Date   BREAST BIOPSY Left 06/20/2017   FIBROCYSTIC CHANGE of MRI bx, dumbell marker   CERVICAL BIOPSY  W/ LOOP ELECTRODE EXCISION  2001   [place]   CESAREAN SECTION  2006, 2008   LAPAROSCOPIC BILATERAL SALPINGECTOMY Bilateral 04/24/2019   Procedure: LAPAROSCOPIC BILATERAL SALPINGO- OOPHORECTOMY;  Surgeon: Malachy Mood, MD;  Location: [facility] ORS;  Service: Gynecology;  Laterality: Bilateral;   [place] ABLATION  2013   [facility]   TUBAL LIGATION  2008    Gynecologic History: No LMP recorded. (Menstrual status: Other).  Obstetric History: [id number]  Family History:  Family History  Problem Relation Age of Onset   Cancer Father 77       lung   Hypertension Mother    Breast cancer Mother        88 (medullary carcinoma, poorly differentiated) and 29   Breast cancer Paternal Aunt 32   Breast cancer Maternal  Grandmother 35   Cancer Paternal Grandmother        BLADDER; KIDNEY   Cancer Maternal Grandfather        STOMACH    Social History:  Social History   Socioeconomic History   Marital status: Married    Spouse name: [name]   Number of children: 2   Years of education: 13   Highest education level: Not on file  Occupational History   Occupation: [employer]    Comment: [employer]  Tobacco Use   Smoking status: Never Smoker   Smokeless tobacco: Never Used  Vaping Use   Vaping Use: Never used  Substance and Sexual Activity   Alcohol use: No    Alcohol/week: 0.0 standard drinks   Drug use: No   Sexual activity: Yes    Birth control/protection: None  Other Topics Concern   Not on file  Social History Narrative   Not on file   Social Determinants of Health   Financial Resource Strain: Not on file  Food Insecurity: Not on file  Transportation Needs: Not on file  Physical Activity: Not on file  Stress: Not on file  Social Connections: Not on file  Intimate Partner Violence: Not on file    Allergies:  Allergies  Allergen Reactions   Amoxicillin Rash    Did it involve swelling of the face/tongue/throat, SOB, or low BP? No Did it involve sudden or severe  rash/hives, skin peeling, or any reaction on the inside of your mouth or nose? No Did you need to seek medical attention at a hospital or doctor's office? No When did it last happen?10+ years ago   Penicillins Rash    Did it involve swelling of the face/tongue/throat, SOB, or low BP? No Did it involve sudden or severe rash/hives, skin peeling, or any reaction on the inside of your mouth or nose? No Did you need to seek medical attention at a hospital or doctor's office? No When did it last happen?10+ years ago If all above answers are NO, may proceed with cephalosporin use.     Medications: Prior to Admission medications   Medication Sig Start Date End Date Taking? Authorizing  Provider  estradiol (ESTRACE) 1 MG tablet Take 1 tablet (1 mg total) by mouth daily. 09/29/19   Malachy Mood, MD  famotidine (PEPCID) 20 MG tablet Take 1 tablet (20 mg total) by mouth 2 (two) times daily. 06/16/20   Bast, Tressia Miners A, NP  Halobetasol Prop-Tazarotene (DUOBRII) 0.01-0.045 % LOTN Apply to aa's psoriasis on the elbows QD. Once plaques have thinned switch back to Triamcinolone QD PRN flares. 02/17/20   Ralene Bathe, MD  ibuprofen (ADVIL) 800 MG tablet Take 1 tablet (800 mg total) by mouth every 8 (eight) hours as needed. 04/24/19   Malachy Mood, MD  LORazepam (ATIVAN) 0.5 MG tablet Take 1 tablet (0.5 mg total) by mouth every 8 (eight) hours as needed for anxiety. 5/63/87   Copland, Deirdre Evener, PA-C  medroxyPROGESTERone (PROVERA) 5 MG tablet TAKE 1 TABLET BY MOUTH DAILY. 06/14/20   Malachy Mood, MD  phentermine (ADIPEX-P) 37.5 MG tablet Take 1 tablet (37.5 mg total) by mouth daily before breakfast. 06/18/20   Malachy Mood, MD  SUMAtriptan (IMITREX) 100 MG tablet Take 1 tablet (100 mg total) by mouth every 2 (two) hours as needed for migraine. May repeat in 2 hours if headache persists or recurs. 12/31/18   Malachy Mood, MD  triamcinolone cream (KENALOG) 0.1 % Apply 1 application topically as directed. Qd up to 5 days a week to aa elbows for psoriasis prn flares, avoid face, groin, axilla 12/24/19   Ralene Bathe, MD  zolpidem (AMBIEN) 10 MG tablet TAKE 1 TABLET BY MOUTH AT BEDTIME AS NEEDED FOR SLEEP. 56/4/33   Copland, Deirdre Evener, PA-C    Physical Exam There were no vitals taken for this visit. Wt Readings from Last 3 Encounters:  07/16/20 187 lb 12.8 oz (85.2 kg)  06/18/20 199 lb (90.3 kg)  04/06/20 193 lb (87.5 kg)  Body mass index is 32.24 kg/m.   General: NAD HEENT: normocephalic, anicteric Thyroid: no enlargement Pulmonary: no increased work of breathing Neurologic: Grossly intact Psychiatric: mood appropriate, affect full  Assessment: 40 y.o. [id number]  medical weight loss follow up  Plan: Problem List Items Addressed This Visit   None   Visit Diagnoses    Class 1 obesity without serious comorbidity with body mass index (BMI) of 32.0 to 32.9 in adult, unspecified obesity type    -  Primary   Relevant Medications   phentermine (ADIPEX-P) 37.5 MG tablet      1) 1500 Calorie ADA Diet  2) Patient education given regarding appropriate lifestyle changes for weight loss including: regular physical activity, healthy coping strategies, caloric restriction and healthy eating patterns.  3) Patient will be started on weight loss medication. The risks and benefits and side effects of medication, such as Adipex (Phenteramine) ,  Tenuate (  Diethylproprion), Belviq (lorcarsin), Contrave (buproprion/naltrexone), Qsymia (phentermine/topiramate), and Saxenda (liraglutide) is discussed. The pros and cons of suppressing appetite and boosting metabolism is discussed. Risks of tolerence and addiction is discussed for selected agents discussed. Use of medicine will ne short term, such as 3-4 months at a time followed by a period of time off of the medicine to avoid these risks and side effects for Adipex, Qsymia, and Tenuate discussed. Pt to call with any negative side effects and agrees to keep follow up appts.  4) Patient to take medication, with the benefits of appetite suppression and metabolism boost d/w pt, along with the side effects and risk factors of long term use that will be avoided with our use of short bursts of therapy. Rx provided.    5) 15 minutes face-to-face; with counseling/coordination of care > 50 percent of visit related to obesity and ongoing management/treatment   6)  Return in about 4 weeks (around 08/13/2020) for medication follow up.    Malachy Mood, MD, [facility], [facility] 07/16/2020, 9:13 AM

## 2020-08-10 ENCOUNTER — Other Ambulatory Visit: Payer: Self-pay

## 2020-08-12 ENCOUNTER — Other Ambulatory Visit: Payer: Self-pay | Admitting: Obstetrics and Gynecology

## 2020-08-12 ENCOUNTER — Ambulatory Visit: Payer: 59 | Admitting: Obstetrics and Gynecology

## 2020-08-12 ENCOUNTER — Other Ambulatory Visit: Payer: Self-pay

## 2020-08-12 MED ORDER — TRAZODONE HCL 150 MG PO TABS
150.0000 mg | ORAL_TABLET | Freq: Every day | ORAL | 3 refills | Status: DC
  Filled 2020-08-12: qty 30, 30d supply, fill #0
  Filled 2020-09-09: qty 30, 30d supply, fill #1
  Filled 2020-10-06: qty 30, 30d supply, fill #2

## 2020-08-12 NOTE — Progress Notes (Signed)
Rx trazodone 150 mg for sleep. Works as well as Microbiologist, but better sleep med option

## 2020-08-16 ENCOUNTER — Other Ambulatory Visit: Payer: Self-pay

## 2020-08-16 ENCOUNTER — Ambulatory Visit (INDEPENDENT_AMBULATORY_CARE_PROVIDER_SITE_OTHER): Payer: 59 | Admitting: Obstetrics and Gynecology

## 2020-08-16 ENCOUNTER — Encounter: Payer: Self-pay | Admitting: Obstetrics and Gynecology

## 2020-08-16 VITALS — BP 120/72 | Ht 64.0 in | Wt 184.0 lb

## 2020-08-16 DIAGNOSIS — E669 Obesity, unspecified: Secondary | ICD-10-CM | POA: Diagnosis not present

## 2020-08-16 DIAGNOSIS — Z6831 Body mass index (BMI) 31.0-31.9, adult: Secondary | ICD-10-CM | POA: Diagnosis not present

## 2020-08-16 MED ORDER — PHENTERMINE HCL 37.5 MG PO TABS
ORAL_TABLET | Freq: Every day | ORAL | 0 refills | Status: DC
  Filled 2020-08-16: qty 30, 30d supply, fill #0

## 2020-08-16 MED ORDER — ESTRADIOL 1 MG PO TABS
ORAL_TABLET | Freq: Every day | ORAL | 11 refills | Status: DC
  Filled 2020-08-16 – 2020-09-17 (×2): qty 30, 30d supply, fill #0
  Filled 2020-10-27: qty 30, 30d supply, fill #1
  Filled 2020-12-17: qty 30, 30d supply, fill #2
  Filled 2021-02-07: qty 30, 30d supply, fill #3
  Filled 2021-03-14: qty 30, 30d supply, fill #4

## 2020-08-16 MED ORDER — FAMOTIDINE 20 MG PO TABS
20.0000 mg | ORAL_TABLET | Freq: Two times a day (BID) | ORAL | 3 refills | Status: DC
Start: 2020-08-16 — End: 2021-08-03
  Filled 2020-08-16: qty 90, 45d supply, fill #0
  Filled 2020-10-28: qty 90, 45d supply, fill #1
  Filled 2021-01-03: qty 90, 45d supply, fill #2
  Filled 2021-03-14: qty 90, 45d supply, fill #3

## 2020-08-16 NOTE — Progress Notes (Signed)
Gynecology Office Visit  Chief Complaint:  Chief Complaint  Patient presents with  . Follow-up    Weight loss - Refills. RM 4    History of Present Illness: Patientis a 40 y.o. G63P2002 female, who presents for the evaluation of the desire to lose weight. She has lost 3 pounds 1 months, 2 month total weight loss of 15lbs. The patient states the following symptoms since starting her weight loss therapy: appetite suppression, energy, and weight loss.  The patient also reports no other ill effects. The patient specifically denies heart palpitations, anxiety, and insomnia.    Review of Systems: 10 point review of systems negative unless otherwise noted in HPI  Past Medical History:  Past Medical History:  Diagnosis Date  . BRCA1 gene mutation positive 2014   BRCA 1 valine substitution error. [facility]  . Family history of breast cancer   . History of Papanicolaou smear of cervix 04/24/13; 06/04/14   -/+; neg  . Migraine   . Pap smear abnormality of cervix with ASCUS favoring dysplasia   . Vitamin D deficiency     Past Surgical History:  Past Surgical History:  Procedure Laterality Date  . BREAST BIOPSY Left 06/20/2017   FIBROCYSTIC CHANGE of MRI bx, dumbell marker  . CERVICAL BIOPSY  W/ LOOP ELECTRODE EXCISION  2001   [place]  . CESAREAN SECTION  2006, 2008  . LAPAROSCOPIC BILATERAL SALPINGECTOMY Bilateral 04/24/2019   Procedure: LAPAROSCOPIC BILATERAL SALPINGO- OOPHORECTOMY;  Surgeon: Malachy Mood, MD;  Location: [facility] ORS;  Service: Gynecology;  Laterality: Bilateral;  . [place] ABLATION  2013   [facility]  . TUBAL LIGATION  2008    Gynecologic History: No LMP recorded. (Menstrual status: Other).  Obstetric History: [id number]  Family History:  Family History  Problem Relation Age of Onset  . Cancer Father 70       lung  . Hypertension Mother   . Breast cancer Mother        51 (medullary carcinoma, poorly differentiated) and 72  . Breast cancer Paternal Aunt 81   . Breast cancer Maternal Grandmother 31  . Cancer Paternal Grandmother        BLADDER; KIDNEY  . Cancer Maternal Grandfather        STOMACH    Social History:  Social History   Socioeconomic History  . Marital status: Married    Spouse name: [name]  . Number of children: 2  . Years of education: 3  . Highest education level: Not on file  Occupational History  . 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 Smoking status: Never Smoker  . Smokeless tobacco: Never Used  Vaping Use  . Vaping Use: Never used  Substance and Sexual Activity  . Alcohol use: No    Alcohol/week: 0.0 standard drinks  . Drug use: No  . Sexual activity: Yes    Birth control/protection: None  Other Topics Concern  . Not on file  Social History Narrative  . Not on file   Social Determinants of Health   Financial Resource Strain: Not on file  Food Insecurity: Not on file  Transportation Needs: Not on file  Physical Activity: Not on file  Stress: Not on file  Social Connections: Not on file  Intimate Partner Violence: Not on file    Allergies:  Allergies  Allergen Reactions  . Amoxicillin Rash    Did it involve swelling of the face/tongue/throat, SOB, or low BP? No Did  it involve sudden or severe rash/hives, skin peeling, or any reaction on the inside of your mouth or nose? No Did you need to seek medical attention at a hospital or doctor's office? No When did it last happen?10+ years ago  . Penicillins Rash    Did it involve swelling of the face/tongue/throat, SOB, or low BP? No Did it involve sudden or severe rash/hives, skin peeling, or any reaction on the inside of your mouth or nose? No Did you need to seek medical attention at a hospital or doctor's office? No When did it last happen?10+ years ago If all above answers are "NO", may proceed with cephalosporin use.     Medications: Prior to Admission medications   Medication Sig Start Date End  Date Taking? Authorizing Provider  estradiol (ESTRACE) 1 MG tablet TAKE 1 TABLET (1 MG TOTAL) BY MOUTH DAILY. 09/29/19 09/28/20  Malachy Mood, MD  famotidine (PEPCID) 20 MG tablet TAKE 1 TABLET BY MOUTH TWO TIMES DAILY 06/16/20 06/16/21  Loura Halt A, NP  Halobetasol Prop-Tazarotene (DUOBRII) 0.01-0.045 % LOTN Apply to aa's psoriasis on the elbows QD. Once plaques have thinned switch back to Triamcinolone QD PRN flares. 02/17/20   Ralene Bathe, MD  ibuprofen (ADVIL) 800 MG tablet Take 1 tablet (800 mg total) by mouth every 8 (eight) hours as needed. 04/24/19   Malachy Mood, MD  LORazepam (ATIVAN) 0.5 MG tablet Take 1 tablet (0.5 mg total) by mouth every 8 (eight) hours as needed for anxiety. 0/23/34   Copland, Deirdre Evener, PA-C  medroxyPROGESTERone (PROVERA) 5 MG tablet TAKE 1 TABLET BY MOUTH DAILY. 06/14/20 06/14/21  Malachy Mood, MD  medroxyPROGESTERone (PROVERA) 5 MG tablet TAKE 1 TABLET (5 MG TOTAL) BY MOUTH DAILY. 09/29/19 09/28/20  Malachy Mood, MD  phentermine (ADIPEX-P) 37.5 MG tablet TAKE 1 TABLET (37.5 MG TOTAL) BY MOUTH DAILY BEFORE BREAKFAST. 07/16/20 01/12/21  Malachy Mood, MD  SUMAtriptan (IMITREX) 100 MG tablet Take 1 tablet (100 mg total) by mouth every 2 (two) hours as needed for migraine. May repeat in 2 hours if headache persists or recurs. 12/31/18   Malachy Mood, MD  traZODone (DESYREL) 150 MG tablet Take 1 tablet (150 mg total) by mouth at bedtime. 07/11/66   Copland, Elmo Putt B, PA-C  triamcinolone cream (KENALOG) 0.1 % Apply 1 application topically as directed. Qd up to 5 days a week to aa elbows for psoriasis prn flares, avoid face, groin, axilla 12/24/19   Ralene Bathe, MD    Physical Exam Blood pressure 120/72, height '5\' 4"'  (1.626 m), weight 184 lb (83.5 kg). Wt Readings from Last 3 Encounters:  08/16/20 184 lb (83.5 kg)  07/16/20 187 lb 12.8 oz (85.2 kg)  06/18/20 199 lb (90.3 kg)  Body mass index is 31.58 kg/m.   General: NAD HEENT:  normocephalic, anicteric Thyroid: no enlargement Pulmonary: no increased work of breathing Neurologic: Grossly intact Psychiatric: mood appropriate, affect full  Assessment: 40 y.o. [id number] No problem-specific Assessment & Plan notes found for this encounter.   Plan: Problem List Items Addressed This Visit   None     1) 1500 Calorie ADA Diet  2) Patient education given regarding appropriate lifestyle changes for weight loss including: regular physical activity, healthy coping strategies, caloric restriction and healthy eating patterns.  3) Patient will be started on weight loss medication. The risks and benefits and side effects of medication, such as Adipex (Phenteramine) ,  Tenuate (Diethylproprion), Contrave (buproprion/naltrexone), Qsymia (phentermine/topiramate), and Saxenda (liraglutide) is discussed. The pros and  cons of suppressing appetite and boosting metabolism is discussed. Risks of tolerence and addiction is discussed for selected agents discussed. Use of medicine will ne short term, such as 3-4 months at a time followed by a period of time off of the medicine to avoid these risks and side effects for Adipex, Qsymia, and Tenuate discussed. Pt to call with any negative side effects and agrees to keep follow up appts.  4) Patient to take medication, with the benefits of appetite suppression and metabolism boost d/w pt, along with the side effects and risk factors of long term use that will be avoided with our use of short bursts of therapy. Rx provided.    5) 15 minutes face-to-face; with counseling/coordination of care > 50 percent of visit related to obesity and ongoing management/treatment   6)  Return in about 4 weeks (around 09/13/2020) for medication follow up.    Malachy Mood, MD, [facility], [facility] 08/16/2020, 8:15 AM

## 2020-08-29 ENCOUNTER — Other Ambulatory Visit: Payer: Self-pay

## 2020-08-29 MED FILL — Medroxyprogesterone Acetate Tab 5 MG: ORAL | 30 days supply | Qty: 30 | Fill #0 | Status: AC

## 2020-08-30 ENCOUNTER — Other Ambulatory Visit: Payer: Self-pay

## 2020-09-09 ENCOUNTER — Other Ambulatory Visit: Payer: Self-pay

## 2020-09-13 ENCOUNTER — Encounter: Payer: Self-pay | Admitting: Obstetrics and Gynecology

## 2020-09-13 ENCOUNTER — Other Ambulatory Visit: Payer: Self-pay

## 2020-09-13 ENCOUNTER — Ambulatory Visit (INDEPENDENT_AMBULATORY_CARE_PROVIDER_SITE_OTHER): Payer: 59 | Admitting: Obstetrics and Gynecology

## 2020-09-13 VITALS — BP 118/74 | Ht 64.0 in | Wt 181.0 lb

## 2020-09-13 DIAGNOSIS — Z6831 Body mass index (BMI) 31.0-31.9, adult: Secondary | ICD-10-CM

## 2020-09-13 DIAGNOSIS — E669 Obesity, unspecified: Secondary | ICD-10-CM

## 2020-09-13 MED ORDER — MEDROXYPROGESTERONE ACETATE 5 MG PO TABS
ORAL_TABLET | Freq: Every day | ORAL | 11 refills | Status: DC
  Filled 2020-09-13 – 2020-09-17 (×2): qty 30, 30d supply, fill #0
  Filled 2020-10-27: qty 30, 30d supply, fill #1
  Filled 2021-01-02: qty 30, 30d supply, fill #2
  Filled 2021-03-03: qty 30, 30d supply, fill #3

## 2020-09-13 MED ORDER — PHENTERMINE HCL 37.5 MG PO TABS
ORAL_TABLET | Freq: Every day | ORAL | 0 refills | Status: DC
  Filled 2020-09-13: qty 30, 30d supply, fill #0

## 2020-09-13 NOTE — Progress Notes (Signed)
Gynecology Office Visit  Chief Complaint:  Chief Complaint  Patient presents with  . Weight Check    F/U weight loss - no concerns. RM 4    History of Present Illness: Patientis a 40 y.o. G68P2002 female, who presents for the evaluation of the desire to lose weight. She has lost 3 pounds 1 months, for 3 month wight loss of 18lbs. The patient states the following symptoms since starting her weight loss therapy: appetite suppression, energy, and weight loss.  The patient also reports no other ill effects. The patient specifically denies heart palpitations, anxiety, and insomnia.    Review of Systems: 10 point review of systems negative unless otherwise noted in HPI  Past Medical History:  Past Medical History:  Diagnosis Date  . BRCA1 gene mutation positive 2014   BRCA 1 valine substitution error. [facility]  . Family history of breast cancer   . History of Papanicolaou smear of cervix 04/24/13; 06/04/14   -/+; neg  . Migraine   . Pap smear abnormality of cervix with ASCUS favoring dysplasia   . Vitamin D deficiency     Past Surgical History:  Past Surgical History:  Procedure Laterality Date  . BREAST BIOPSY Left 06/20/2017   FIBROCYSTIC CHANGE of MRI bx, dumbell marker  . CERVICAL BIOPSY  W/ LOOP ELECTRODE EXCISION  2001   [place]  . CESAREAN SECTION  2006, 2008  . LAPAROSCOPIC BILATERAL SALPINGECTOMY Bilateral 04/24/2019   Procedure: LAPAROSCOPIC BILATERAL SALPINGO- OOPHORECTOMY;  Surgeon: Malachy Mood, MD;  Location: [facility] ORS;  Service: Gynecology;  Laterality: Bilateral;  . [place] ABLATION  2013   [facility]  . TUBAL LIGATION  2008    Gynecologic History: No LMP recorded. (Menstrual status: Other).  Obstetric History: [id number]  Family History:  Family History  Problem Relation Age of Onset  . Cancer Father 70       lung  . Hypertension Mother   . Breast cancer Mother        42 (medullary carcinoma, poorly differentiated) and 52  . Breast cancer Paternal  Aunt 41  . Breast cancer Maternal Grandmother 30  . Cancer Paternal Grandmother        BLADDER; KIDNEY  . Cancer Maternal Grandfather        STOMACH    Social History:  Social History   Socioeconomic History  . Marital status: Married    Spouse name: [name]  . Number of children: 2  . Years of education: 69  . Highest education level: Not on file  Occupational History  . 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 Smoking status: Never Smoker  . Smokeless tobacco: Never Used  Vaping Use  . Vaping Use: Never used  Substance and Sexual Activity  . Alcohol use: No    Alcohol/week: 0.0 standard drinks  . Drug use: No  . Sexual activity: Yes    Birth control/protection: None  Other Topics Concern  . Not on file  Social History Narrative  . Not on file   Social Determinants of Health   Financial Resource Strain: Not on file  Food Insecurity: Not on file  Transportation Needs: Not on file  Physical Activity: Not on file  Stress: Not on file  Social Connections: Not on file  Intimate Partner Violence: Not on file    Allergies:  Allergies  Allergen Reactions  . Amoxicillin Rash    Did it involve swelling of the face/tongue/throat, SOB, or low  BP? No Did it involve sudden or severe rash/hives, skin peeling, or any reaction on the inside of your mouth or nose? No Did you need to seek medical attention at a hospital or doctor's office? No When did it last happen?10+ years ago  . Penicillins Rash    Did it involve swelling of the face/tongue/throat, SOB, or low BP? No Did it involve sudden or severe rash/hives, skin peeling, or any reaction on the inside of your mouth or nose? No Did you need to seek medical attention at a hospital or doctor's office? No When did it last happen?10+ years ago If all above answers are "NO", may proceed with cephalosporin use.     Medications: Prior to Admission medications   Medication Sig Start  Date End Date Taking? Authorizing Provider  estradiol (ESTRACE) 1 MG tablet TAKE 1 TABLET (1 MG TOTAL) BY MOUTH DAILY. 08/16/20 08/16/21 Yes Malachy Mood, MD  famotidine (PEPCID) 20 MG tablet Take 1 tablet (20 mg total) by mouth 2 (two) times daily. 08/16/20  Yes Malachy Mood, MD  Halobetasol Prop-Tazarotene (DUOBRII) 0.01-0.045 % LOTN Apply to aa's psoriasis on the elbows QD. Once plaques have thinned switch back to Triamcinolone QD PRN flares. 02/17/20  Yes Ralene Bathe, MD  ibuprofen (ADVIL) 800 MG tablet Take 1 tablet (800 mg total) by mouth every 8 (eight) hours as needed. 04/24/19  Yes Malachy Mood, MD  LORazepam (ATIVAN) 0.5 MG tablet Take 1 tablet (0.5 mg total) by mouth every 8 (eight) hours as needed for anxiety. 01/25/09  Yes Copland, Deirdre Evener, PA-C  medroxyPROGESTERone (PROVERA) 5 MG tablet TAKE 1 TABLET (5 MG TOTAL) BY MOUTH DAILY. 09/29/19 09/29/20 Yes Malachy Mood, MD  phentermine (ADIPEX-P) 37.5 MG tablet TAKE 1 TABLET (37.5 MG TOTAL) BY MOUTH DAILY BEFORE BREAKFAST. 08/16/20 02/12/21 Yes Malachy Mood, MD  traZODone (DESYREL) 150 MG tablet Take 1 tablet (150 mg total) by mouth at bedtime. 0/7/12  Yes Copland, Elmo Putt B, PA-C  triamcinolone cream (KENALOG) 0.1 % Apply 1 application topically as directed. Qd up to 5 days a week to aa elbows for psoriasis prn flares, avoid face, groin, axilla 12/24/19  Yes Ralene Bathe, MD  SUMAtriptan (IMITREX) 100 MG tablet Take 1 tablet (100 mg total) by mouth every 2 (two) hours as needed for migraine. May repeat in 2 hours if headache persists or recurs. 12/31/18   Malachy Mood, MD    Physical Exam Blood pressure 118/74, height '5\' 4"'  (1.626 m), weight 181 lb (82.1 kg). Wt Readings from Last 3 Encounters:  09/13/20 181 lb (82.1 kg)  08/16/20 184 lb (83.5 kg)  07/16/20 187 lb 12.8 oz (85.2 kg)  Body mass index is 31.07 kg/m.   General: NAD HEENT: normocephalic, anicteric Thyroid: no enlargement Pulmonary: no  increased work of breathing Neurologic: Grossly intact Psychiatric: mood appropriate, affect full  Assessment: 40 y.o. [id number] medication follow up Plan: Problem List Items Addressed This Visit   None   Visit Diagnoses    Class 1 obesity with body mass index (BMI) of 31.0 to 31.9 in adult, unspecified obesity type, unspecified whether serious comorbidity present    -  Primary   Relevant Medications   phentermine (ADIPEX-P) 37.5 MG tablet      1) 1500 Calorie ADA Diet  2) Patient education given regarding appropriate lifestyle changes for weight loss including: regular physical activity, healthy coping strategies, caloric restriction and healthy eating patterns.  3) Patient to take medication, with the benefits of appetite suppression and  metabolism boost d/w pt, along with the side effects and risk factors of long term use that will be avoided with our use of short bursts of therapy. Rx provided.   - discussed 3 months off after this 4th month or switching to Saxenda  4) 15 minutes face-to-face; with counseling/coordination of care > 50 percent of visit related to obesity and ongoing management/treatment   5)  Return in about 4 weeks (around 10/11/2020) for medication follow up.    Malachy Mood, MD, [facility], [facility] 09/13/2020, 8:12 AM

## 2020-09-13 NOTE — Addendum Note (Signed)
Addended by: Dorthula Nettles on: 09/13/2020 08:33 AM   Modules accepted: Level of Service

## 2020-09-17 ENCOUNTER — Other Ambulatory Visit: Payer: Self-pay

## 2020-09-20 ENCOUNTER — Other Ambulatory Visit: Payer: Self-pay

## 2020-09-22 ENCOUNTER — Other Ambulatory Visit: Payer: Self-pay

## 2020-09-29 NOTE — Progress Notes (Signed)
Shannon George, Shannon Evener, PA-C   Chief Complaint  Patient presents with  . Breast Exam    HPI:      Shannon George is a 40 y.o. [id number] whose LMP was No LMP recorded. (Menstrual status: Other)., presents today for 6 mo CBE due to BRCA 1 gene mutation. No breast complaints.   Her menses are absent due to endometrial ablation 2013/lap BSO 12/20 due to BRCA 1 mutation.   PT IS BRCA 1 POS. The patient does do self-breast exams. She is doing yearly mammo (last one 1/22) and screening breast MRI (scheduled 7/22), as well as CBE. She is taking Vit D supp 5,000 IU daily due to deficiency in past, normal levels 3/22 after being too high on 10,000 IU 11/21. Is s/p BSO; aware of prophylactic mastectomy and tamoxifen recommendations   Past Medical History:  Diagnosis Date  . BRCA1 gene mutation positive 2014   BRCA 1 valine substitution error. [facility]  . Family history of breast cancer   . History of Papanicolaou smear of cervix 04/24/13; 06/04/14   -/+; neg  . Migraine   . Pap smear abnormality of cervix with ASCUS favoring dysplasia   . Vitamin D deficiency     Past Surgical History:  Procedure Laterality Date  . BREAST BIOPSY Left 06/20/2017   FIBROCYSTIC CHANGE of MRI bx, dumbell marker  . CERVICAL BIOPSY  W/ LOOP ELECTRODE EXCISION  2001   [place]  . CESAREAN SECTION  2006, 2008  . LAPAROSCOPIC BILATERAL SALPINGECTOMY Bilateral 04/24/2019   Procedure: LAPAROSCOPIC BILATERAL SALPINGO- OOPHORECTOMY;  Surgeon: Malachy Mood, MD;  Location: [facility] ORS;  Service: Gynecology;  Laterality: Bilateral;  . [place] ABLATION  2013   [facility]  . TUBAL LIGATION  2008    Family History  Problem Relation Age of Onset  . Cancer Father 50       lung  . Hypertension Mother   . Breast cancer Mother        69 (medullary carcinoma, poorly differentiated) and 4  . Breast cancer Paternal Aunt 80  . Breast cancer Maternal Grandmother 15  . Cancer Paternal Grandmother        BLADDER; KIDNEY  .  Cancer Maternal Grandfather        STOMACH    Social History   Socioeconomic History  . Marital status: Married    Spouse name: [name]  . Number of children: 2  . Years of education: 64  . Highest education level: Not on file  Occupational History  . 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 Smoking status: Never Smoker  . Smokeless tobacco: Never Used  Vaping Use  . Vaping Use: Never used  Substance and Sexual Activity  . Alcohol use: No    Alcohol/week: 0.0 standard drinks  . Drug use: No  . Sexual activity: Yes    Birth control/protection: None  Other Topics Concern  . Not on file  Social History Narrative  . Not on file   Social Determinants of Health   Financial Resource Strain: Not on file  Food Insecurity: Not on file  Transportation Needs: Not on file  Physical Activity: Not on file  Stress: Not on file  Social Connections: Not on file  Intimate Partner Violence: Not on file    Outpatient Medications Prior to Visit  Medication Sig Dispense Refill  . estradiol (ESTRACE) 1 MG tablet TAKE 1 TABLET (1 MG TOTAL) BY MOUTH DAILY. 30 tablet  11  . famotidine (PEPCID) 20 MG tablet Take 1 tablet (20 mg total) by mouth 2 (two) times daily. 90 tablet 3  . Halobetasol Prop-Tazarotene (DUOBRII) 0.01-0.045 % LOTN Apply to aa's psoriasis on the elbows QD. Once plaques have thinned switch back to Triamcinolone QD PRN flares. 100 g 0  . ibuprofen (ADVIL) 800 MG tablet Take 1 tablet (800 mg total) by mouth every 8 (eight) hours as needed. 30 tablet 1  . LORazepam (ATIVAN) 0.5 MG tablet Take 1 tablet (0.5 mg total) by mouth every 8 (eight) hours as needed for anxiety. 30 tablet 0  . medroxyPROGESTERone (PROVERA) 5 MG tablet TAKE 1 TABLET (5 MG TOTAL) BY MOUTH DAILY. 30 tablet 11  . phentermine (ADIPEX-P) 37.5 MG tablet TAKE 1 TABLET (37.5 MG TOTAL) BY MOUTH DAILY BEFORE BREAKFAST. 30 tablet 0  . SUMAtriptan (IMITREX) 100 MG tablet Take 1 tablet (100 mg  total) by mouth every 2 (two) hours as needed for migraine. May repeat in 2 hours if headache persists or recurs. 5 tablet 5  . traZODone (DESYREL) 150 MG tablet Take 1 tablet (150 mg total) by mouth at bedtime. 30 tablet 3  . triamcinolone cream (KENALOG) 0.1 % Apply 1 application topically as directed. Qd up to 5 days a week to aa elbows for psoriasis prn flares, avoid face, groin, axilla 80 g 0   No facility-administered medications prior to visit.      ROS:  Review of Systems  Constitutional: Negative for fever.  Gastrointestinal: Negative for blood in stool, constipation, diarrhea, nausea and vomiting.  Genitourinary: Negative for dyspareunia, dysuria, flank pain, frequency, hematuria, urgency, vaginal bleeding, vaginal discharge and vaginal pain.  Musculoskeletal: Negative for back pain.  Skin: Negative for rash.   BREAST: No symptoms   OBJECTIVE:   Vitals:  BP 110/60   Ht '5\' 4"'  (1.626 m)   Wt 182 lb (82.6 kg)   BMI 31.24 kg/m   Physical Exam Vitals reviewed.  Pulmonary:     Effort: Pulmonary effort is normal.  Chest:  Breasts: Breasts are symmetrical.     Right: No inverted nipple, mass, nipple discharge, skin change or tenderness.     Left: No inverted nipple, mass, nipple discharge, skin change or tenderness.    Musculoskeletal:        General: Normal range of motion.     Cervical back: Normal range of motion.  Skin:    General: Skin is warm and dry.  Neurological:     General: No focal deficit present.     Mental Status: She is alert and oriented to person, place, and time.     Cranial Nerves: No cranial nerve deficit.  Psychiatric:        Mood and Affect: Mood normal.        Behavior: Behavior normal.        Thought Content: Thought content normal.        Judgment: Judgment normal.     Assessment/Plan: Normal breast exam--neg breast exam. Repeat at 11/22 annual. Recheck Vit D 11/22 to make sure 5,000 IU is adequate dose.  BRCA1 gene mutation  positive     Return in about 6 months (around 34/91/7915) for annual.  Keilani Terrance B. Latese Dufault, PA-C 09/30/2020 8:13 AM

## 2020-09-30 ENCOUNTER — Ambulatory Visit (INDEPENDENT_AMBULATORY_CARE_PROVIDER_SITE_OTHER): Payer: 59 | Admitting: Obstetrics and Gynecology

## 2020-09-30 ENCOUNTER — Encounter: Payer: Self-pay | Admitting: Obstetrics and Gynecology

## 2020-09-30 ENCOUNTER — Other Ambulatory Visit: Payer: Self-pay

## 2020-09-30 VITALS — BP 110/60 | Ht 64.0 in | Wt 182.0 lb

## 2020-09-30 DIAGNOSIS — Z1501 Genetic susceptibility to malignant neoplasm of breast: Secondary | ICD-10-CM

## 2020-09-30 DIAGNOSIS — Z1509 Genetic susceptibility to other malignant neoplasm: Secondary | ICD-10-CM | POA: Diagnosis not present

## 2020-09-30 DIAGNOSIS — Z0000 Encounter for general adult medical examination without abnormal findings: Secondary | ICD-10-CM | POA: Diagnosis not present

## 2020-09-30 NOTE — Patient Instructions (Signed)
I value your feedback and you entrusting us with your care. If you get a [facility] patient survey, I would appreciate you taking the time to let us know about your experience today. Thank you! ? ? ?

## 2020-10-05 ENCOUNTER — Ambulatory Visit: Payer: 59 | Admitting: Obstetrics and Gynecology

## 2020-10-06 ENCOUNTER — Other Ambulatory Visit: Payer: Self-pay

## 2020-10-27 ENCOUNTER — Other Ambulatory Visit: Payer: Self-pay

## 2020-10-27 ENCOUNTER — Encounter: Payer: Self-pay | Admitting: Emergency Medicine

## 2020-10-27 ENCOUNTER — Ambulatory Visit
Admission: EM | Admit: 2020-10-27 | Discharge: 2020-10-27 | Disposition: A | Discharge status: Home / Self Care | Payer: 59 | Attending: Emergency Medicine | Admitting: Emergency Medicine

## 2020-10-27 DIAGNOSIS — M940 Chondrocostal junction syndrome [Tietze]: Secondary | ICD-10-CM

## 2020-10-27 DIAGNOSIS — M546 Pain in thoracic spine: Secondary | ICD-10-CM

## 2020-10-27 MED ORDER — IBUPROFEN 800 MG PO TABS
800.0000 mg | ORAL_TABLET | Freq: Three times a day (TID) | ORAL | 0 refills | Status: AC | PRN
Start: 2020-10-27 — End: ?
  Filled 2020-10-27: qty 21, 7d supply, fill #0

## 2020-10-27 MED ORDER — METHOCARBAMOL 500 MG PO TABS
500.0000 mg | ORAL_TABLET | Freq: Two times a day (BID) | ORAL | 0 refills | Status: DC | PRN
  Filled 2020-10-27: qty 10, 5d supply, fill #0

## 2020-10-27 NOTE — ED Provider Notes (Signed)
Shannon George    CSN: [id number] Arrival date & time: 10/27/20  0803      History   Chief Complaint Chief Complaint  Patient presents with   Chest Pain    HPI Shannon George is a 40 y.o. female.  Patient presents with 2-day history of right anterior lower ribcage pain.  No falls or trauma.  She states the pain is now radiating around to her mid back.  The pain occurs with movement and deep breaths and palpation of the area.  No treatments attempted at home.  She denies numbness, weakness, fever, chills, cough, shortness of breath, abdominal pain, or other symptoms.  Her medical history includes migraine headaches, GERD, vitamin D deficiency.  The history is provided by the patient and medical records.   Past Medical History:  Diagnosis Date   BRCA1 gene mutation positive 2014   BRCA 1 valine substitution error. [facility]   Family history of breast cancer    GERD (gastroesophageal reflux disease) 2022   History of Papanicolaou smear of cervix 04/24/13; 06/04/14   -/+; neg   Migraine    Pap smear abnormality of cervix with ASCUS favoring dysplasia    Vitamin D deficiency     Patient Active Problem List   Diagnosis Date Noted   Vitamin D deficiency 04/06/2020   Abdominal pain, right upper quadrant 07/18/2014   BRCA gene positive 07/18/2014   BRCA1 gene mutation positive 05/08/2012    Past Surgical History:  Procedure Laterality Date   BREAST BIOPSY Left 06/20/2017   FIBROCYSTIC CHANGE of MRI bx, dumbell marker   CERVICAL BIOPSY  W/ LOOP ELECTRODE EXCISION  2001   Shannon George   CESAREAN SECTION  2006, 2008   LAPAROSCOPIC BILATERAL SALPINGECTOMY Bilateral 04/24/2019   Procedure: LAPAROSCOPIC BILATERAL SALPINGO- OOPHORECTOMY;  Surgeon: Malachy Mood, MD;  Location: [facility] ORS;  Service: Gynecology;  Laterality: Bilateral;   [place] ABLATION  2013   [facility]   TUBAL LIGATION  2008    OB History     Gravida  2   Para  2   Term  2   Preterm      AB       Living  2      SAB      IAB      Ectopic      Multiple      Live Births  2        Obstetric Comments  1st Menstrual Cycle:  11 1st Pregnancy:  23          Home Medications    Prior to Admission medications   Medication Sig Start Date End Date Taking? Authorizing Provider  estradiol (ESTRACE) 1 MG tablet TAKE 1 TABLET (1 MG TOTAL) BY MOUTH DAILY. 08/16/20 08/16/21 Yes Malachy Mood, MD  famotidine (PEPCID) 20 MG tablet Take 1 tablet (20 mg total) by mouth 2 (two) times daily. 08/16/20  Yes Malachy Mood, MD  ibuprofen (ADVIL) 800 MG tablet Take 1 tablet (800 mg total) by mouth every 8 (eight) hours as needed. 10/27/20  Yes Sharion Balloon, NP  medroxyPROGESTERone (PROVERA) 5 MG tablet TAKE 1 TABLET (5 MG TOTAL) BY MOUTH DAILY. 09/13/20 09/13/21 Yes Malachy Mood, MD  methocarbamol (ROBAXIN) 500 MG tablet Take 1 tablet (500 mg total) by mouth 2 (two) times daily as needed for muscle spasms. 10/27/20  Yes Sharion Balloon, NP  Halobetasol Prop-Tazarotene (DUOBRII) 0.01-0.045 % LOTN Apply to aa's psoriasis on the elbows QD. Once plaques have  thinned switch back to Triamcinolone QD PRN flares. 02/17/20   Ralene Bathe, MD  LORazepam (ATIVAN) 0.5 MG tablet Take 1 tablet (0.5 mg total) by mouth every 8 (eight) hours as needed for anxiety. 05/08/73   Copland, Deirdre Evener, PA-C  phentermine (ADIPEX-P) 37.5 MG tablet TAKE 1 TABLET (37.5 MG TOTAL) BY MOUTH DAILY BEFORE BREAKFAST. 09/13/20 03/12/21  Malachy Mood, MD  SUMAtriptan (IMITREX) 100 MG tablet Take 1 tablet (100 mg total) by mouth every 2 (two) hours as needed for migraine. May repeat in 2 hours if headache persists or recurs. 12/31/18   Malachy Mood, MD  traZODone (DESYREL) 150 MG tablet Take 1 tablet (150 mg total) by mouth at bedtime. 1/0/25   Copland, Elmo Putt B, PA-C  triamcinolone cream (KENALOG) 0.1 % Apply 1 application topically as directed. Qd up to 5 days a week to aa elbows for psoriasis prn flares, avoid face,  groin, axilla 12/24/19   Ralene Bathe, MD    Family History Family History  Problem Relation Age of Onset   Cancer Father 87       lung   Hypertension Mother    Breast cancer Mother        38 (medullary carcinoma, poorly differentiated) and 60   Breast cancer Paternal Aunt 24   Breast cancer Maternal Grandmother 76   Cancer Paternal Grandmother        BLADDER; KIDNEY   Cancer Maternal Grandfather        STOMACH    Social History Social History   Tobacco Use   Smoking status: Never   Smokeless tobacco: Never  Vaping Use   Vaping Use: Never used  Substance Use Topics   Alcohol use: No    Alcohol/week: 0.0 standard drinks   Drug use: No     Allergies   Amoxicillin and Penicillins   Review of Systems Review of Systems  Constitutional:  Negative for chills and fever.  HENT:  Negative for ear pain and sore throat.   Respiratory:  Negative for cough and shortness of breath.   Cardiovascular:  Positive for chest pain. Negative for palpitations.  Gastrointestinal:  Negative for abdominal pain, diarrhea and vomiting.  Musculoskeletal:  Positive for back pain. Negative for gait problem and neck pain.  Skin:  Negative for color change and rash.  Neurological:  Negative for weakness and numbness.  All other systems reviewed and are negative.   Physical Exam Triage Vital Signs ED Triage Vitals  Enc Vitals Group     BP      Pulse      Resp      Temp      Temp src      SpO2      Weight      Height      Head Circumference      Peak Flow      Pain Score      Pain Loc      Pain Edu?      Excl. in [place]?    No data found.  Updated Vital Signs BP 114/75 (BP Location: Left Arm)   Pulse 70   Temp 98.8 F (37.1 C) (Oral)   Resp 18   SpO2 98%   Visual Acuity Right Eye Distance:   Left Eye Distance:   Bilateral Distance:    Right Eye Near:   Left Eye Near:    Bilateral Near:     Physical Exam Vitals and nursing note reviewed.  Constitutional:  General: She is not in acute distress.    Appearance: She is well-developed. She is not ill-appearing.  HENT:     Head: Normocephalic and atraumatic.     Mouth/Throat:     Mouth: Mucous membranes are moist.  Eyes:     Conjunctiva/sclera: Conjunctivae normal.  Cardiovascular:     Rate and Rhythm: Normal rate and regular rhythm.     Heart sounds: Normal heart sounds. No murmur heard. Pulmonary:     Effort: Pulmonary effort is normal. No respiratory distress.     Breath sounds: Normal breath sounds.    Chest:    Abdominal:     General: Bowel sounds are normal.     Palpations: Abdomen is soft.     Tenderness: There is no abdominal tenderness. There is no guarding or rebound.  Musculoskeletal:        General: Tenderness present. No swelling, deformity or signs of injury. Normal range of motion.     Cervical back: Neck supple.     Comments: Right anterior lower rib cage mildly tender to palpation.  Right mid back mildly tender to palpation.  No rash, ecchymosis, erythema, wounds.  Skin:    General: Skin is warm and dry.     Capillary Refill: Capillary refill takes less than 2 seconds.     Findings: No bruising, erythema, lesion or rash.  Neurological:     General: No focal deficit present.     Mental Status: She is alert and oriented to person, place, and time.     Sensory: No sensory deficit.     Motor: No weakness.     Gait: Gait normal.  Psychiatric:        Mood and Affect: Mood normal.        Behavior: Behavior normal.     UC Treatments / Results  Labs (all labs ordered are listed, but only abnormal results are displayed) Labs Reviewed - No data to display  EKG   Radiology No results found.  Procedures Procedures (including critical care time)  Medications Ordered in UC Medications - No data to display  Initial Impression / Assessment and Plan / UC Course  I have reviewed the triage vital signs and the nursing notes.  Pertinent labs & imaging results that  were available during my care of the patient were reviewed by me and considered in my medical decision making (see chart for details).  Costochondritis, acute right mid back pain.  No indication of infection or injury.  EKG shows sinus rhythm, rate 68, no ST elevation, compared to previous from February 2022.  Discussed limitations of evaluation of chest pain in an urgent care setting.  ED precautions given.  Instructed patient to follow-up with her PCP as soon as possible.  Treating today with ibuprofen and Robaxin.  Precautions for drowsiness with Robaxin discussed.  Patient agrees to plan of care.   Final Clinical Impressions(s) / UC Diagnoses   Final diagnoses:  Costochondritis  Acute right-sided thoracic back pain     Discharge Instructions      Take ibuprofen as needed for discomfort.  Take the muscle relaxer as needed for muscle spasm; Do not drive, operate machinery, or drink alcohol with this medication as it can cause drowsiness.   Follow up with your primary care provider or an orthopedist if your symptoms are not improving.         ED Prescriptions     Medication Sig Dispense Auth. Provider   ibuprofen (ADVIL) 800 MG  tablet Take 1 tablet (800 mg total) by mouth every 8 (eight) hours as needed. 21 tablet Sharion Balloon, NP   methocarbamol (ROBAXIN) 500 MG tablet Take 1 tablet (500 mg total) by mouth 2 (two) times daily as needed for muscle spasms. 10 tablet Sharion Balloon, NP      PDMP not reviewed this encounter.   Sharion Balloon, NP 10/27/20 [phone]

## 2020-10-27 NOTE — Discharge Instructions (Addendum)
Take ibuprofen as needed for discomfort.  Take the muscle relaxer as needed for muscle spasm; Do not drive, operate machinery, or drink alcohol with this medication as it can cause drowsiness.   Follow up with your primary care provider or an orthopedist if your symptoms are not improving.     

## 2020-10-27 NOTE — ED Triage Notes (Signed)
Patient c/o RT sided rib cage pain x 2 days  Patient endorses pain has progressively become worst.   Patient states "pain is underneath my breast".   Patient denies any fall or trauma.   Patient denies palpitations,dizziness, LOC, or SOB.   Patient endorses increased pain with inhalation and "while sleeping".   Patient states " I attempted to sleep upright at night and it helped with the pain".   Patient denies taking any medications for pain.

## 2020-10-28 ENCOUNTER — Telehealth: Payer: Self-pay | Admitting: Emergency Medicine

## 2020-10-28 NOTE — Telephone Encounter (Signed)
Patient called today requesting an extension of her Work Note. K.Hall Busing NP notified and extension given and new work note printed.

## 2020-10-29 ENCOUNTER — Other Ambulatory Visit: Payer: Self-pay

## 2020-11-12 ENCOUNTER — Other Ambulatory Visit: Payer: Self-pay | Admitting: Obstetrics and Gynecology

## 2020-11-12 ENCOUNTER — Other Ambulatory Visit: Payer: Self-pay

## 2020-11-12 MED ORDER — TRAZODONE HCL 150 MG PO TABS
150.0000 mg | ORAL_TABLET | Freq: Every day | ORAL | 1 refills | Status: DC
  Filled 2020-11-12: qty 90, 90d supply, fill #0
  Filled 2021-03-01: qty 90, 90d supply, fill #1

## 2020-11-12 NOTE — Progress Notes (Signed)
Rx RF trazodone, doing well

## 2020-11-22 ENCOUNTER — Ambulatory Visit: Payer: 59

## 2020-12-01 ENCOUNTER — Ambulatory Visit
Admission: RE | Admit: 2020-12-01 | Discharge: 2020-12-01 | Disposition: A | Discharge status: Home / Self Care | Payer: 59 | Source: Ambulatory Visit | Attending: Obstetrics and Gynecology | Admitting: Obstetrics and Gynecology

## 2020-12-01 ENCOUNTER — Other Ambulatory Visit: Payer: Self-pay

## 2020-12-01 DIAGNOSIS — Z9189 Other specified personal risk factors, not elsewhere classified: Secondary | ICD-10-CM | POA: Diagnosis not present

## 2020-12-01 DIAGNOSIS — Z1501 Genetic susceptibility to malignant neoplasm of breast: Secondary | ICD-10-CM | POA: Diagnosis not present

## 2020-12-01 DIAGNOSIS — Z15068 Genetic susceptibility to other malignant neoplasm of digestive system: Secondary | ICD-10-CM

## 2020-12-01 DIAGNOSIS — Z1509 Genetic susceptibility to other malignant neoplasm: Secondary | ICD-10-CM | POA: Insufficient documentation

## 2020-12-01 DIAGNOSIS — Z1239 Encounter for other screening for malignant neoplasm of breast: Secondary | ICD-10-CM

## 2020-12-01 DIAGNOSIS — N6489 Other specified disorders of breast: Secondary | ICD-10-CM | POA: Diagnosis not present

## 2020-12-01 MED ORDER — GADOBUTROL 1 MMOL/ML IV SOLN
7.5000 mL | Freq: Once | INTRAVENOUS | Status: AC | PRN
  Administered 2020-12-01: 7.5 mL via INTRAVENOUS

## 2020-12-17 ENCOUNTER — Other Ambulatory Visit: Payer: Self-pay

## 2021-01-03 ENCOUNTER — Other Ambulatory Visit: Payer: Self-pay

## 2021-01-16 ENCOUNTER — Other Ambulatory Visit: Payer: Self-pay

## 2021-01-16 ENCOUNTER — Encounter: Payer: Self-pay | Admitting: Emergency Medicine

## 2021-01-16 ENCOUNTER — Ambulatory Visit
Admission: EM | Admit: 2021-01-16 | Discharge: 2021-01-16 | Disposition: A | Discharge status: Home / Self Care | Payer: 59 | Attending: Emergency Medicine | Admitting: Emergency Medicine

## 2021-01-16 DIAGNOSIS — B349 Viral infection, unspecified: Secondary | ICD-10-CM | POA: Diagnosis not present

## 2021-01-16 DIAGNOSIS — Z1152 Encounter for screening for COVID-19: Secondary | ICD-10-CM | POA: Diagnosis not present

## 2021-01-16 NOTE — Discharge Instructions (Addendum)
Your COVID test is pending.  You should self quarantine until the test result is back.    Take Tylenol or ibuprofen as needed for fever or discomfort.  Rest and keep yourself hydrated.    Follow-up with your primary care provider if your symptoms are not improving.     

## 2021-01-16 NOTE — ED Provider Notes (Signed)
Shannon George    CSN: [id number] Arrival date & time: 01/16/21  1223      History   Chief Complaint Chief Complaint  Patient presents with   Otalgia    HPI Shannon George is a 40 y.o. female.  Patient presents with 4-day history of earache, postnasal drip, congestion, sore throat, cough.  Treatment attempted at home with Allegra and Mucinex.  She denies fever, chills, rash, shortness of breath, or other symptoms.    The history is provided by the patient and medical records.   Past Medical History:  Diagnosis Date   BRCA1 gene mutation positive 2014   BRCA 1 valine substitution error. [facility]   Family history of breast cancer    GERD (gastroesophageal reflux disease) 2022   History of Papanicolaou smear of cervix 04/24/13; 06/04/14   -/+; neg   Migraine    Pap smear abnormality of cervix with ASCUS favoring dysplasia    Vitamin D deficiency     Patient Active Problem List   Diagnosis Date Noted   Vitamin D deficiency 04/06/2020   Abdominal pain, right upper quadrant 07/18/2014   BRCA gene positive 07/18/2014   BRCA1 gene mutation positive 05/08/2012    Past Surgical History:  Procedure Laterality Date   BREAST BIOPSY Left 06/20/2017   FIBROCYSTIC CHANGE of MRI bx, dumbell marker   CERVICAL BIOPSY  W/ LOOP ELECTRODE EXCISION  2001   [facility]   CESAREAN SECTION  2006, 2008   LAPAROSCOPIC BILATERAL SALPINGECTOMY Bilateral 04/24/2019   Procedure: LAPAROSCOPIC BILATERAL SALPINGO- OOPHORECTOMY;  Surgeon: Malachy Mood, MD;  Location: [facility] ORS;  Service: Gynecology;  Laterality: Bilateral;   NOVASURE ABLATION  2013   [facility]   TUBAL LIGATION  2008    OB History     Gravida  2   Para  2   Term  2   Preterm      AB      Living  2      SAB      IAB      Ectopic      Multiple      Live Births  2        Obstetric Comments  1st Menstrual Cycle:  11 1st Pregnancy:  23          Home Medications    Prior to Admission medications    Medication Sig Start Date End Date Taking? Authorizing Provider  estradiol (ESTRACE) 1 MG tablet TAKE 1 TABLET (1 MG TOTAL) BY MOUTH DAILY. 08/16/20 08/16/21  Malachy Mood, MD  famotidine (PEPCID) 20 MG tablet Take 1 tablet (20 mg total) by mouth 2 (two) times daily. 08/16/20   Malachy Mood, MD  Halobetasol Prop-Tazarotene (DUOBRII) 0.01-0.045 % LOTN Apply to aa's psoriasis on the elbows QD. Once plaques have thinned switch back to Triamcinolone QD PRN flares. 02/17/20   Ralene Bathe, MD  ibuprofen (ADVIL) 800 MG tablet Take 1 tablet (800 mg total) by mouth every 8 (eight) hours as needed. 10/27/20   Sharion Balloon, NP  LORazepam (ATIVAN) 0.5 MG tablet Take 1 tablet (0.5 mg total) by mouth every 8 (eight) hours as needed for anxiety. 07/21/15   Copland, Deirdre Evener, PA-C  medroxyPROGESTERone (PROVERA) 5 MG tablet TAKE 1 TABLET (5 MG TOTAL) BY MOUTH DAILY. 09/13/20 09/13/21  Malachy Mood, MD  methocarbamol (ROBAXIN) 500 MG tablet Take 1 tablet (500 mg total) by mouth 2 (two) times daily as needed for muscle spasms. 10/27/20   Hall Busing,  Fredderick Phenix, NP  phentermine (ADIPEX-P) 37.5 MG tablet TAKE 1 TABLET (37.5 MG TOTAL) BY MOUTH DAILY BEFORE BREAKFAST. 09/13/20 03/12/21  Malachy Mood, MD  SUMAtriptan (IMITREX) 100 MG tablet Take 1 tablet (100 mg total) by mouth every 2 (two) hours as needed for migraine. May repeat in 2 hours if headache persists or recurs. 12/31/18   Malachy Mood, MD  traZODone (DESYREL) 150 MG tablet Take 1 tablet (150 mg total) by mouth at bedtime. 05[birth year]10[birth year]43   Copland, Elmo Putt B, PA-C  triamcinolone cream (KENALOG) 0.1 % Apply 1 application topically as directed. Qd up to 5 days a week to aa elbows for psoriasis prn flares, avoid face, groin, axilla 12/24/19   Ralene Bathe, MD    Family History Family History  Problem Relation Age of Onset   Cancer Father 33       lung   Hypertension Mother    Breast cancer Mother        2 (medullary carcinoma, poorly differentiated) and  12   Breast cancer Paternal Aunt 58   Breast cancer Maternal Grandmother 49   Cancer Paternal Grandmother        BLADDER; KIDNEY   Cancer Maternal Grandfather        STOMACH    Social History Social History   Tobacco Use   Smoking status: Never   Smokeless tobacco: Never  Vaping Use   Vaping Use: Never used  Substance Use Topics   Alcohol use: No    Alcohol/week: 0.0 standard drinks   Drug use: No     Allergies   Amoxicillin and Penicillins   Review of Systems Review of Systems  Constitutional:  Negative for chills and fever.  HENT:  Positive for congestion, ear pain, postnasal drip and sore throat.   Respiratory:  Positive for cough. Negative for shortness of breath.   Cardiovascular:  Negative for chest pain and palpitations.  Gastrointestinal:  Negative for abdominal pain, diarrhea and vomiting.  Skin:  Negative for color change and rash.  All other systems reviewed and are negative.   Physical Exam Triage Vital Signs ED Triage Vitals  Enc Vitals Group     BP      Pulse      Resp      Temp      Temp src      SpO2      Weight      Height      Head Circumference      Peak Flow      Pain Score      Pain Loc      Pain Edu?      Excl. in [place]?    No data found.  Updated Vital Signs BP 122/85   Pulse 83   Temp 98.7 F (37.1 C)   Resp 18   SpO2 98%   Visual Acuity Right Eye Distance:   Left Eye Distance:   Bilateral Distance:    Right Eye Near:   Left Eye Near:    Bilateral Near:     Physical Exam Vitals and nursing note reviewed.  Constitutional:      General: She is not in acute distress.    Appearance: She is well-developed. She is not ill-appearing.  HENT:     Head: Normocephalic and atraumatic.     Right Ear: Tympanic membrane and ear canal normal.     Left Ear: Tympanic membrane and ear canal normal.     Nose: Nose normal.  Mouth/Throat:     Mouth: Mucous membranes are moist.     Pharynx: Oropharynx is clear.  Eyes:      Conjunctiva/sclera: Conjunctivae normal.  Cardiovascular:     Rate and Rhythm: Normal rate and regular rhythm.     Heart sounds: Normal heart sounds.  Pulmonary:     Effort: Pulmonary effort is normal. No respiratory distress.     Breath sounds: Normal breath sounds.  Abdominal:     Palpations: Abdomen is soft.     Tenderness: There is no abdominal tenderness.  Musculoskeletal:     Cervical back: Neck supple.  Skin:    General: Skin is warm and dry.  Neurological:     General: No focal deficit present.     Mental Status: She is alert and oriented to person, place, and time.     Gait: Gait normal.  Psychiatric:        Mood and Affect: Mood normal.        Behavior: Behavior normal.     UC Treatments / Results  Labs (all labs ordered are listed, but only abnormal results are displayed) Labs Reviewed - No data to display  EKG   Radiology No results found.  Procedures Procedures (including critical care time)  Medications Ordered in UC Medications - No data to display  Initial Impression / Assessment and Plan / UC Course  I have reviewed the triage vital signs and the nursing notes.  Pertinent labs & imaging results that were available during my care of the patient were reviewed by me and considered in my medical decision making (see chart for details).   Viral illness.  COVID pending.  Instructed patient to self quarantine per CDC guidelines.  Discussed symptomatic treatment including Tylenol or ibuprofen, rest, hydration.  Instructed patient to follow up with PCP if symptoms are not improving.  Patient agrees to plan of care.    Final Clinical Impressions(s) / UC Diagnoses   Final diagnoses:  Viral illness     Discharge Instructions      Your COVID test is pending.  You should self quarantine until the test result is back.    Take Tylenol or ibuprofen as needed for fever or discomfort.  Rest and keep yourself hydrated.    Follow-up with your primary care  provider if your symptoms are not improving.         ED Prescriptions   None    PDMP not reviewed this encounter.   Sharion Balloon, NP 01/16/21 1255

## 2021-01-16 NOTE — ED Triage Notes (Signed)
Bilateral ear pain and post nasal drainage x 4 days.

## 2021-01-17 LAB — NOVEL CORONAVIRUS, NAA: SARS-CoV-2, NAA: NOT DETECTED

## 2021-01-17 LAB — SARS-COV-2, NAA 2 DAY TAT

## 2021-02-07 ENCOUNTER — Other Ambulatory Visit: Payer: Self-pay

## 2021-03-01 ENCOUNTER — Other Ambulatory Visit: Payer: Self-pay

## 2021-03-03 ENCOUNTER — Other Ambulatory Visit: Payer: Self-pay

## 2021-03-07 ENCOUNTER — Other Ambulatory Visit: Payer: Self-pay

## 2021-03-07 ENCOUNTER — Encounter: Payer: Self-pay | Admitting: Dermatology

## 2021-03-07 ENCOUNTER — Ambulatory Visit (INDEPENDENT_AMBULATORY_CARE_PROVIDER_SITE_OTHER): Payer: 59 | Admitting: Dermatology

## 2021-03-07 DIAGNOSIS — L578 Other skin changes due to chronic exposure to nonionizing radiation: Secondary | ICD-10-CM | POA: Diagnosis not present

## 2021-03-07 DIAGNOSIS — D1800 Hemangioma unspecified site: Secondary | ICD-10-CM | POA: Diagnosis not present

## 2021-03-07 DIAGNOSIS — L821 Other seborrheic keratosis: Secondary | ICD-10-CM | POA: Diagnosis not present

## 2021-03-07 DIAGNOSIS — L814 Other melanin hyperpigmentation: Secondary | ICD-10-CM | POA: Diagnosis not present

## 2021-03-07 DIAGNOSIS — Z1283 Encounter for screening for malignant neoplasm of skin: Secondary | ICD-10-CM | POA: Diagnosis not present

## 2021-03-07 DIAGNOSIS — L409 Psoriasis, unspecified: Secondary | ICD-10-CM

## 2021-03-07 DIAGNOSIS — D229 Melanocytic nevi, unspecified: Secondary | ICD-10-CM

## 2021-03-07 MED ORDER — DUOBRII 0.01-0.045 % EX LOTN
TOPICAL_LOTION | CUTANEOUS | 0 refills | Status: DC
Start: 2021-03-07 — End: 2022-03-08

## 2021-03-07 NOTE — Patient Instructions (Addendum)
Psoriasis is a chronic non-curable, but treatable genetic/hereditary disease that may have other systemic features affecting other organ systems such as joints (Psoriatic Arthritis). It is associated with an increased risk of inflammatory bowel disease, heart disease, non-alcoholic fatty liver disease, and depression.    Continue Duobrii once daily as needed to elbows for flares. Avoid applying to face, groin, and axilla. Use as directed. Long-term use can cause thinning of the skin.  Topical steroids (such as triamcinolone, fluocinolone, fluocinonide, mometasone, clobetasol, halobetasol, betamethasone, hydrocortisone) can cause thinning and lightening of the skin if they are used for too long in the same area. Your physician has selected the right strength medicine for your problem and area affected on the body. Please use your medication only as directed by your physician to prevent side effects.   Melanoma ABCDEs  Melanoma is the most dangerous type of skin cancer, and is the leading cause of death from skin disease.  You are more likely to develop melanoma if you: Have light-colored skin, light-colored eyes, or red or blond hair Spend a lot of time in the sun Tan regularly, either outdoors or in a tanning bed Have had blistering sunburns, especially during childhood Have a close family member who has had a melanoma Have atypical moles or large birthmarks  Early detection of melanoma is key since treatment is typically straightforward and cure rates are extremely high if we catch it early.   The first sign of melanoma is often a change in a mole or a new dark spot.  The ABCDE system is a way of remembering the signs of melanoma.  A for asymmetry:  The two halves do not match. B for border:  The edges of the growth are irregular. C for color:  A mixture of colors are present instead of an even brown color. D for diameter:  Melanomas are usually (but not always) greater than 2mm - the size of  a pencil eraser. E for evolution:  The spot keeps changing in size, shape, and color.  Please check your skin once per month between visits. You can use a small mirror in front and a large mirror behind you to keep an eye on the back side or your body.   If you see any new or changing lesions before your next follow-up, please call to schedule a visit.  Please continue daily skin protection including broad spectrum sunscreen SPF 30+ to sun-exposed areas, reapplying every 2 hours as needed when you're outdoors.    If you have any questions or concerns for your doctor, please call our main line at [phone] and press option 4 to reach your doctor's medical assistant. If no one answers, please leave a voicemail as directed and we will return your call as soon as possible. Messages left after 4 pm will be answered the following business day.   You may also send [place] a message via [place]. We typically respond to MyChart messages within 1-2 business days.  For prescription refills, please ask your pharmacy to contact our office. Our fax number is [phone].  If you have an urgent issue when the clinic is closed that cannot wait until the next business day, you can page your doctor at the number below.    Please note that while we do our best to be available for urgent issues outside of office hours, we are not available 24/7.   If you have an urgent issue and are unable to reach [place], you may choose to  seek medical care at your doctor's office, retail clinic, urgent care center, or emergency room.  If you have a medical emergency, please immediately call 911 or go to the emergency department.  Pager Numbers  - Dr. Nehemiah Massed: [phone]  - Dr. Laurence Ferrari: [phone]  - Dr. Nicole Kindred: [phone]  In the event of inclement weather, please call our main line at [phone] for an update on the status of any delays or closures.  Dermatology Medication Tips: Please keep the boxes that topical  medications come in in order to help keep track of the instructions about where and how to use these. Pharmacies typically print the medication instructions only on the boxes and not directly on the medication tubes.   If your medication is too expensive, please contact our office at [phone] option 4 or send [place] a message through [place].   We are unable to tell what your co-pay for medications will be in advance as this is different depending on your insurance coverage. However, we may be able to find a substitute medication at lower cost or fill out paperwork to get insurance to cover a needed medication.   If a prior authorization is required to get your medication covered by your insurance company, please allow [place] 1-2 business days to complete this process.  Drug prices often vary depending on where the prescription is filled and some pharmacies may offer cheaper prices.  The website www.goodrx.com contains coupons for medications through different pharmacies. The prices here do not account for what the cost may be with help from insurance (it may be cheaper with your insurance), but the website can give you the price if you did not use any insurance.  - You can print the associated coupon and take it with your prescription to the pharmacy.  - You may also stop by our office during regular business hours and pick up a GoodRx coupon card.  - If you need your prescription sent electronically to a different pharmacy, notify our office through [facility] or by phone at [phone] option 4.

## 2021-03-07 NOTE — Progress Notes (Deleted)
   Follow-Up Visit   Subjective  Shannon George is a 40 y.o. female who presents for the following: TBSE (Patient here for full body skin exam and skin cancer screening. Patient with no hx, no new or changing spots patient is aware. Patient's grandmother with hx of melanoma. ).  Patient also with hx of psoriasis, currently using Duobrii at elbows. Well controlled per patient.   The following portions of the chart were reviewed this encounter and updated as appropriate:       Review of Systems:  No other skin or systemic complaints except as noted in HPI or Assessment and Plan.  Objective  Well appearing patient in no apparent distress; mood and affect are within normal limits.  A full examination was performed including scalp, head, eyes, ears, nose, lips, neck, chest, axillae, abdomen, back, buttocks, bilateral upper extremities, bilateral lower extremities, hands, feet, fingers, toes, fingernails, and toenails. All findings within normal limits unless otherwise noted below.    Assessment & Plan  Psoriasis Left Elbow - Posterior  Psoriasis is a chronic non-curable, but treatable genetic/hereditary disease that may have other systemic features affecting other organ systems such as joints (Psoriatic Arthritis). It is associated with an increased risk of inflammatory bowel disease, heart disease, non-alcoholic fatty liver disease, and depression.    Related Medications triamcinolone cream (KENALOG) 0.1 % Apply 1 application topically as directed. Qd up to 5 days a week to aa elbows for psoriasis prn flares, avoid face, groin, axilla  No follow-ups on file.  Graciella Belton, RMA, am acting as scribe for Sarina Ser, MD .

## 2021-03-07 NOTE — Progress Notes (Signed)
Follow-Up Visit   Subjective  Shannon George is a 40 y.o. female who presents for the following: TBSE (Patient here for full body skin exam and skin cancer screening. Patient with no hx, no new or changing spots patient is aware. Patient's grandmother with hx of melanoma. ) and Psoriasis (Patient with psoriasis at elbows, currently using Duobrii and well controlled.).  The following portions of the chart were reviewed this encounter and updated as appropriate:   Tobacco  Allergies  Meds  Problems  Med Hx  Surg Hx  Fam Hx     Review of Systems:  No other skin or systemic complaints except as noted in HPI or Assessment and Plan.  Objective  Well appearing patient in no apparent distress; mood and affect are within normal limits.  A full examination was performed including scalp, head, eyes, ears, nose, lips, neck, chest, axillae, abdomen, back, buttocks, bilateral upper extremities, bilateral lower extremities, hands, feet, fingers, toes, fingernails, and toenails. All findings within normal limits unless otherwise noted below.  bilateral elbow Mostly clear   Assessment & Plan  Psoriasis -improving on Duobrii bilateral elbow  Psoriasis is a chronic non-curable, but treatable genetic/hereditary disease that may have other systemic features affecting other organ systems such as joints (Psoriatic Arthritis). It is associated with an increased risk of inflammatory bowel disease, heart disease, non-alcoholic fatty liver disease, and depression.    Continue Duobrii once daily as needed to elbows for flares. Avoid applying to face, groin, and axilla. Use as directed. Long-term use can cause thinning of the skin.  Topical steroids (such as triamcinolone, fluocinolone, fluocinonide, mometasone, clobetasol, halobetasol, betamethasone, hydrocortisone) can cause thinning and lightening of the skin if they are used for too long in the same area. Your physician has selected the right strength  medicine for your problem and area affected on the body. Please use your medication only as directed by your physician to prevent side effects.   Related Medications triamcinolone cream (KENALOG) 0.1 % Apply 1 application topically as directed. Qd up to 5 days a week to aa elbows for psoriasis prn flares, avoid face, groin, axilla  Halobetasol Prop-Tazarotene (DUOBRII) 0.01-0.045 % LOTN Apply to aa's psoriasis on the elbows QD. Once plaques have thinned switch back to Triamcinolone QD PRN flares.  Skin cancer screening  Lentigines - Scattered tan macules - Due to sun exposure - Benign-appearing, observe - Recommend daily broad spectrum sunscreen SPF 30+ to sun-exposed areas, reapply every 2 hours as needed. - Call for any changes  Seborrheic Keratoses - Stuck-on, waxy, tan-brown papules and/or plaques  - Benign-appearing - Discussed benign etiology and prognosis. - Observe - Call for any changes  Melanocytic Nevi - Tan-brown and/or pink-flesh-colored symmetric macules and papules - Benign appearing on exam today - Observation - Call clinic for new or changing moles - Recommend daily use of broad spectrum spf 30+ sunscreen to sun-exposed areas.   Hemangiomas - Red papules - Discussed benign nature - Observe - Call for any changes  Actinic Damage - Chronic condition, secondary to cumulative UV/sun exposure - diffuse scaly erythematous macules with underlying dyspigmentation - Recommend daily broad spectrum sunscreen SPF 30+ to sun-exposed areas, reapply every 2 hours as needed.  - Staying in the shade or wearing long sleeves, sun glasses (UVA+UVB protection) and wide brim hats (4-inch brim around the entire circumference of the hat) are also recommended for sun protection.  - Call for new or changing lesions.  Skin cancer screening performed today.  Return  in about 1 year (around 03/07/2022) for TBSE, Psoriasis.  Graciella Belton, RMA, am acting as scribe for Sarina Ser, MD . Documentation: I have reviewed the above documentation for accuracy and completeness, and I agree with the above.  Sarina Ser, MD

## 2021-03-14 ENCOUNTER — Other Ambulatory Visit: Payer: Self-pay

## 2021-03-24 DIAGNOSIS — H5213 Myopia, bilateral: Secondary | ICD-10-CM | POA: Diagnosis not present

## 2021-04-06 NOTE — Progress Notes (Signed)
PCP:  Chad Cordial, PA-C   Chief Complaint  Patient presents with   Gynecologic Exam    No concerns     HPI:      [name] is a 40 y.o. [phone] who LMP was No LMP recorded. Patient has had an ablation.Shannon George, presents today for her annual examination.  Her menses are absent due to endometrial ablation 2013/lap BSO 12/20 due to BRCA 1 mutation. No vaginal bleeding/pelvic pain. On estradiol 1 mg and provera 5 mg daily, occas vasomotor sx.   Sex activity: single partner, contraception - tubal ligation/BSO. No vaginal dryness, occas pelvic pain with sex. Last Pap: 03/21/18  Results were: no abnormalities /neg HPV DNA  Hx of STDs: HPV on cx/LEEP 2001  Last mammogram: 06/03/20 Results were normal, repeat in 12 months. Had neg breast MRI 7/22. There is a FH of breast cancer in her mom, MGM, and pat aunt. There is no FH of ovarian cancer.  PT IS BRCA 1 POS. The patient does do self-breast exams. She is doing yearly mammo and screening breast MRI (done 7/22), as well as Q6 months CBE. She is taking Vit D supp 5,000 IU daily due to deficiency in past, normal levels 3/22 (had gotten too high with 10,000 IUD dose). Is s/p BSO; aware of prophylactic mastectomy and tamoxifen recommendations.  No FH pancreatic cancer.   Tobacco use: The patient denies current or previous tobacco use. Alcohol use: none No drug use.  Exercise: moderately active  She does get adequate calcium and Vitamin D in her diet.  Normal fasting labs 9/20 through [facility]. Due this yr.  Has insomnia, taking trazodone now several days wkly, was able to come off [place].  Takes lorazepam prn occas, doesn't need RF currently.    Past Medical History:  Diagnosis Date   BRCA1 gene mutation positive 2014   BRCA 1 valine substitution error. [facility]   Family history of breast cancer    GERD (gastroesophageal reflux disease) 2022   History of Papanicolaou smear of cervix 04/24/13; 06/04/14   -/+; neg    Migraine    Pap smear abnormality of cervix with ASCUS favoring dysplasia    Vitamin D deficiency     Past Surgical History:  Procedure Laterality Date   BREAST BIOPSY Left 06/20/2017   FIBROCYSTIC CHANGE of MRI bx, dumbell marker   CERVICAL BIOPSY  W/ LOOP ELECTRODE EXCISION  2001   [place]   CESAREAN SECTION  2006, 2008   LAPAROSCOPIC BILATERAL SALPINGECTOMY Bilateral 04/24/2019   Procedure: LAPAROSCOPIC BILATERAL SALPINGO- OOPHORECTOMY;  Surgeon: Malachy Mood, MD;  Location: [facility] ORS;  Service: Gynecology;  Laterality: Bilateral;   [place] ABLATION  2013   [facility]   TUBAL LIGATION  2008    Family History  Problem Relation Age of Onset   Cancer Father 79       lung   Hypertension Mother    Breast cancer Mother        64 (medullary carcinoma, poorly differentiated) and 49   Breast cancer Paternal Aunt 84   Breast cancer Maternal Grandmother 22   Cancer Paternal Grandmother        BLADDER; KIDNEY   Cancer Maternal Grandfather        STOMACH    Social History   Socioeconomic History   Marital status: Married    Spouse name: [name]   Number of children: 2   Years of education: 13   Highest education level: Not on file  Occupational History   Occupation: [employer]    Comment: [employer]  Tobacco Use   Smoking status: Never   Smokeless tobacco: Never  Vaping Use   Vaping Use: Never used  Substance and Sexual Activity   Alcohol use: No    Alcohol/week: 0.0 standard drinks   Drug use: No   Sexual activity: Yes    Birth control/protection: None, Surgical    Comment: Ablation  Other Topics Concern   Not on file  Social History Narrative   Not on file   Social Determinants of Health   Financial Resource Strain: Not on file  Food Insecurity: Not on file  Transportation Needs: Not on file  Physical Activity: Not on file  Stress: Not on file  Social Connections: Not on file  Intimate Partner Violence: Not on file    Current Meds  Medication Sig    famotidine (PEPCID) 20 MG tablet Take 1 tablet (20 mg total) by mouth 2 (two) times daily.   Halobetasol Prop-Tazarotene (DUOBRII) 0.01-0.045 % LOTN Apply to aa's psoriasis on the elbows QD. Once plaques have thinned switch back to Triamcinolone QD PRN flares.   ibuprofen (ADVIL) 800 MG tablet Take 1 tablet (800 mg total) by mouth every 8 (eight) hours as needed.   LORazepam (ATIVAN) 0.5 MG tablet Take 1 tablet (0.5 mg total) by mouth every 8 (eight) hours as needed for anxiety.   SUMAtriptan (IMITREX) 100 MG tablet Take 1 tablet (100 mg total) by mouth every 2 (two) hours as needed for migraine. May repeat in 2 hours if headache persists or recurs.   triamcinolone cream (KENALOG) 0.1 % Apply 1 application topically as directed. Qd up to 5 days a week to aa elbows for psoriasis prn flares, avoid face, groin, axilla   [DISCONTINUED] estradiol (ESTRACE) 1 MG tablet TAKE 1 TABLET (1 MG TOTAL) BY MOUTH DAILY.   [DISCONTINUED] medroxyPROGESTERone (PROVERA) 5 MG tablet TAKE 1 TABLET (5 MG TOTAL) BY MOUTH DAILY.   [DISCONTINUED] traZODone (DESYREL) 150 MG tablet Take 1 tablet (150 mg total) by mouth at bedtime.     ROS:  Review of Systems  Constitutional:  Negative for fatigue, fever and unexpected weight change.  Respiratory:  Negative for cough, shortness of breath and wheezing.   Cardiovascular:  Negative for chest pain, palpitations and leg swelling.  Gastrointestinal:  Negative for blood in stool, constipation, diarrhea, nausea and vomiting.  Endocrine: Negative for cold intolerance, heat intolerance and polyuria.  Genitourinary:  Negative for dyspareunia, dysuria, flank pain, frequency, genital sores, hematuria, menstrual problem, pelvic pain, urgency, vaginal bleeding, vaginal discharge and vaginal pain.  Musculoskeletal:  Negative for back pain, joint swelling and myalgias.  Skin:  Negative for rash.  Neurological:  Negative for dizziness, syncope, light-headedness, numbness and headaches.   Hematological:  Negative for adenopathy.  Psychiatric/Behavioral:  Negative for agitation, confusion, sleep disturbance and suicidal ideas. The patient is not nervous/anxious.     Objective: BP 108/70   Ht _0  (1.626 m)   Wt 185 lb (83.9 kg)   BMI 31.76 kg/m    Physical Exam Constitutional:      Appearance: She is well-developed.  Genitourinary:     Vulva normal.     Right Labia: No rash, tenderness or lesions.    Left Labia: No tenderness, lesions or rash.    No vaginal discharge, erythema or tenderness.      Right Adnexa: absent.    Right Adnexa: not tender and no mass present.  Left Adnexa: absent.    Left Adnexa: not tender and no mass present.    No cervical friability or polyp.     Uterus is not enlarged or tender.  Breasts:    Right: No mass, nipple discharge, skin change or tenderness.     Left: No mass, nipple discharge, skin change or tenderness.  Neck:     Thyroid: No thyromegaly.  Cardiovascular:     Rate and Rhythm: Normal rate and regular rhythm.     Heart sounds: Normal heart sounds. No murmur heard. Pulmonary:     Effort: Pulmonary effort is normal.     Breath sounds: Normal breath sounds.  Abdominal:     Palpations: Abdomen is soft.     Tenderness: There is no abdominal tenderness. There is no guarding or rebound.  Musculoskeletal:        General: Normal range of motion.     Cervical back: Normal range of motion.  Lymphadenopathy:     Cervical: No cervical adenopathy.  Neurological:     General: No focal deficit present.     Mental Status: She is alert and oriented to person, place, and time.     Cranial Nerves: No cranial nerve deficit.  Skin:    General: Skin is warm and dry.  Psychiatric:        Mood and Affect: Mood normal.        Behavior: Behavior normal.        Thought Content: Thought content normal.        Judgment: Judgment normal.  Vitals reviewed.    Assessment/Plan: Encounter for annual routine gynecological  examination  Cervical cancer screening - Plan: Cytology - PAP  Screening for HPV (human papillomavirus) - Plan: Cytology - PAP  Encounter for screening mammogram for malignant neoplasm of breast - Plan: MM 3D SCREEN BREAST BILATERAL; pt to sched mammo  BRCA1 gene mutation positive - Plan: MM 3D SCREEN BREAST BILATERAL; pt aware of monthly SBE, Q6 mo CBE, yearly mammo and scr breast MRI. Will do MRI ref after mammo. Cont Vit D supp. Pt also aware of option for prophylactic mastectomy vs tamoxifen. Pt is s/p BSO. NO FH pancreatic ca.  Increased risk of breast cancer - Plan: MM 3D SCREEN BREAST BILATERAL  Hormone replacement therapy (HRT) - Plan: medroxyPROGESTERone (PROVERA) 5 MG tablet, estradiol (ESTRACE) 1 MG tablet; Rx RF.  Insomnia, unspecified type - Plan: traZODone (DESYREL) 150 MG tablet; Rx RF  Blood tests for routine general physical examination - Plan: Lipid panel, Comprehensive metabolic panel, Hemoglobin A1c  Screening cholesterol level - Plan: Lipid panel  Screening for diabetes mellitus - Plan: Hemoglobin A1c  Vitamin D deficiency - Plan: Vitamin D (25 hydroxy)  Meds ordered this encounter  Medications   traZODone (DESYREL) 150 MG tablet    Sig: Take 1 tablet (150 mg total) by mouth at bedtime.    Dispense:  90 tablet    Refill:  2    Order Specific Question:   Supervising Provider    Answer:   Gae Dry [[id number]]   medroxyPROGESTERone (PROVERA) 5 MG tablet    Sig: TAKE 1 TABLET (5 MG TOTAL) BY MOUTH DAILY.    Dispense:  90 tablet    Refill:  3    Order Specific Question:   Supervising Provider    Answer:   Gae Dry [[id number]]   estradiol (ESTRACE) 1 MG tablet    Sig: TAKE 1 TABLET (1 MG TOTAL) BY MOUTH DAILY.  Dispense:  90 tablet    Refill:  3    Order Specific Question:   Supervising Provider    Answer:   Gae Dry [[id number]]            GYN counsel breast self exam, mammography screening, adequate intake of calcium and vitamin D, diet  and exercise     F/U  Return in about 6 months (around 10/06/2021) for breast exam.  Elmo Putt B. Sudais Banghart, PA-C 04/07/2021 8:30 AM

## 2021-04-07 ENCOUNTER — Other Ambulatory Visit: Payer: Self-pay

## 2021-04-07 ENCOUNTER — Other Ambulatory Visit (HOSPITAL_COMMUNITY)
Admission: RE | Admit: 2021-04-07 | Discharge: 2021-04-07 | Disposition: A | Discharge status: Home / Self Care | Payer: 59 | Source: Ambulatory Visit | Attending: Obstetrics and Gynecology | Admitting: Obstetrics and Gynecology

## 2021-04-07 ENCOUNTER — Encounter: Payer: Self-pay | Admitting: Obstetrics and Gynecology

## 2021-04-07 ENCOUNTER — Ambulatory Visit (INDEPENDENT_AMBULATORY_CARE_PROVIDER_SITE_OTHER): Payer: 59 | Admitting: Obstetrics and Gynecology

## 2021-04-07 VITALS — BP 108/70 | Ht 64.0 in | Wt 185.0 lb

## 2021-04-07 DIAGNOSIS — Z79890 Hormone replacement therapy (postmenopausal): Secondary | ICD-10-CM

## 2021-04-07 DIAGNOSIS — Z1151 Encounter for screening for human papillomavirus (HPV): Secondary | ICD-10-CM | POA: Insufficient documentation

## 2021-04-07 DIAGNOSIS — Z01419 Encounter for gynecological examination (general) (routine) without abnormal findings: Secondary | ICD-10-CM | POA: Diagnosis not present

## 2021-04-07 DIAGNOSIS — Z1231 Encounter for screening mammogram for malignant neoplasm of breast: Secondary | ICD-10-CM

## 2021-04-07 DIAGNOSIS — Z13220 Encounter for screening for lipoid disorders: Secondary | ICD-10-CM

## 2021-04-07 DIAGNOSIS — Z124 Encounter for screening for malignant neoplasm of cervix: Secondary | ICD-10-CM | POA: Insufficient documentation

## 2021-04-07 DIAGNOSIS — Z131 Encounter for screening for diabetes mellitus: Secondary | ICD-10-CM

## 2021-04-07 DIAGNOSIS — Z1501 Genetic susceptibility to malignant neoplasm of breast: Secondary | ICD-10-CM | POA: Diagnosis not present

## 2021-04-07 DIAGNOSIS — Z1509 Genetic susceptibility to other malignant neoplasm: Secondary | ICD-10-CM

## 2021-04-07 DIAGNOSIS — Z0000 Encounter for general adult medical examination without abnormal findings: Secondary | ICD-10-CM

## 2021-04-07 DIAGNOSIS — Z9189 Other specified personal risk factors, not elsewhere classified: Secondary | ICD-10-CM

## 2021-04-07 DIAGNOSIS — G4700 Insomnia, unspecified: Secondary | ICD-10-CM

## 2021-04-07 DIAGNOSIS — E559 Vitamin D deficiency, unspecified: Secondary | ICD-10-CM

## 2021-04-07 MED ORDER — ESTRADIOL 1 MG PO TABS
ORAL_TABLET | Freq: Every day | ORAL | 3 refills | Status: DC
  Filled 2021-04-07 – 2021-05-04 (×2): qty 90, 90d supply, fill #0
  Filled 2021-08-03: qty 90, 90d supply, fill #1
  Filled 2022-01-17: qty 90, 90d supply, fill #2
  Filled 2022-03-24: qty 90, 90d supply, fill #3

## 2021-04-07 MED ORDER — MEDROXYPROGESTERONE ACETATE 5 MG PO TABS
ORAL_TABLET | Freq: Every day | ORAL | 3 refills | Status: DC
  Filled 2021-04-07 – 2021-05-04 (×2): qty 90, 90d supply, fill #0
  Filled 2021-10-11: qty 90, 90d supply, fill #1
  Filled 2022-01-17: qty 90, 90d supply, fill #2
  Filled 2022-03-24: qty 90, 90d supply, fill #3

## 2021-04-07 MED ORDER — TRAZODONE HCL 150 MG PO TABS
150.0000 mg | ORAL_TABLET | Freq: Every day | ORAL | 2 refills | Status: AC
  Filled 2021-04-07 – 2021-07-01 (×2): qty 90, 90d supply, fill #0
  Filled 2022-01-17: qty 90, 90d supply, fill #1

## 2021-04-07 NOTE — Patient Instructions (Signed)
I value your feedback and you entrusting us with your care. If you get a [facility] patient survey, I would appreciate you taking the time to let us know about your experience today. Thank you!  [facility]: [phone]      

## 2021-04-08 ENCOUNTER — Other Ambulatory Visit: Payer: Self-pay

## 2021-04-08 LAB — CYTOLOGY - PAP
Adequacy: ABSENT
Comment: NEGATIVE
Diagnosis: NEGATIVE
High risk HPV: POSITIVE — AB

## 2021-04-11 ENCOUNTER — Other Ambulatory Visit: Payer: Self-pay

## 2021-04-11 ENCOUNTER — Other Ambulatory Visit: Payer: 59

## 2021-04-11 DIAGNOSIS — E559 Vitamin D deficiency, unspecified: Secondary | ICD-10-CM | POA: Diagnosis not present

## 2021-04-11 DIAGNOSIS — Z131 Encounter for screening for diabetes mellitus: Secondary | ICD-10-CM | POA: Diagnosis not present

## 2021-04-11 DIAGNOSIS — Z0000 Encounter for general adult medical examination without abnormal findings: Secondary | ICD-10-CM | POA: Diagnosis not present

## 2021-04-11 DIAGNOSIS — Z13220 Encounter for screening for lipoid disorders: Secondary | ICD-10-CM | POA: Diagnosis not present

## 2021-04-12 LAB — LIPID PANEL
Chol/HDL Ratio: 2.4 ratio (ref 0.0–4.4)
Cholesterol, Total: 147 mg/dL (ref 100–199)
HDL: 61 mg/dL (ref >39)
LDL Chol Calc (NIH): 77 mg/dL (ref 0–99)
Triglycerides: 40 mg/dL (ref 0–149)
VLDL Cholesterol Cal: 9 mg/dL (ref 5–40)

## 2021-04-12 LAB — COMPREHENSIVE METABOLIC PANEL
ALT: 7 IU/L (ref 0–32)
AST: 13 IU/L (ref 0–40)
Albumin/Globulin Ratio: 1.8 (ref 1.2–2.2)
Albumin: 4.3 g/dL (ref 3.8–4.8)
Alkaline Phosphatase: 50 IU/L (ref 44–121)
BUN/Creatinine Ratio: 14 (ref 9–23)
BUN: 13 mg/dL (ref 6–24)
Bilirubin Total: 0.3 mg/dL (ref 0.0–1.2)
CO2: 24 mmol/L (ref 20–29)
Calcium: 9.4 mg/dL (ref 8.7–10.2)
Chloride: 107 mmol/L — ABNORMAL HIGH (ref 96–106)
Creatinine, Ser: 0.94 mg/dL (ref 0.57–1.00)
Globulin, Total: 2.4 g/dL (ref 1.5–4.5)
Glucose: 86 mg/dL (ref 70–99)
Potassium: 4.8 mmol/L (ref 3.5–5.2)
Sodium: 142 mmol/L (ref 134–144)
Total Protein: 6.7 g/dL (ref 6.0–8.5)
eGFR: 79 mL/min/{1.73_m2} (ref >59)

## 2021-04-12 LAB — HEMOGLOBIN A1C
Est. average glucose Bld gHb Est-mCnc: 111 mg/dL
Hgb A1c MFr Bld: 5.5 % (ref 4.8–5.6)

## 2021-04-12 LAB — VITAMIN D 25 HYDROXY (VIT D DEFICIENCY, FRACTURES): Vit D, 25-Hydroxy: 57.7 ng/mL (ref 30.0–100.0)

## 2021-04-26 ENCOUNTER — Other Ambulatory Visit: Payer: Self-pay

## 2021-05-04 ENCOUNTER — Other Ambulatory Visit: Payer: Self-pay

## 2021-05-24 ENCOUNTER — Other Ambulatory Visit: Payer: Self-pay

## 2021-05-24 ENCOUNTER — Other Ambulatory Visit: Payer: Self-pay | Admitting: Obstetrics and Gynecology

## 2021-05-24 DIAGNOSIS — J0100 Acute maxillary sinusitis, unspecified: Secondary | ICD-10-CM

## 2021-05-24 MED ORDER — AZITHROMYCIN 250 MG PO TABS
ORAL_TABLET | ORAL | 0 refills | Status: DC
  Filled 2021-05-24: qty 6, 5d supply, fill #0

## 2021-05-24 NOTE — Progress Notes (Signed)
Rx zpak for sinusitis

## 2021-06-07 ENCOUNTER — Ambulatory Visit
Admission: RE | Admit: 2021-06-07 | Discharge: 2021-06-07 | Disposition: A | Discharge status: Home / Self Care | Payer: 59 | Source: Ambulatory Visit | Attending: Obstetrics and Gynecology | Admitting: Obstetrics and Gynecology

## 2021-06-07 ENCOUNTER — Other Ambulatory Visit: Payer: Self-pay

## 2021-06-07 DIAGNOSIS — Z1509 Genetic susceptibility to other malignant neoplasm: Secondary | ICD-10-CM | POA: Insufficient documentation

## 2021-06-07 DIAGNOSIS — Z1501 Genetic susceptibility to malignant neoplasm of breast: Secondary | ICD-10-CM | POA: Insufficient documentation

## 2021-06-07 DIAGNOSIS — Z9189 Other specified personal risk factors, not elsewhere classified: Secondary | ICD-10-CM | POA: Diagnosis not present

## 2021-06-07 DIAGNOSIS — Z1231 Encounter for screening mammogram for malignant neoplasm of breast: Secondary | ICD-10-CM | POA: Insufficient documentation

## 2021-06-14 ENCOUNTER — Ambulatory Visit: Payer: 59 | Admitting: Obstetrics and Gynecology

## 2021-06-14 ENCOUNTER — Other Ambulatory Visit: Payer: Self-pay

## 2021-06-14 ENCOUNTER — Other Ambulatory Visit: Payer: Self-pay | Admitting: Obstetrics and Gynecology

## 2021-06-14 MED ORDER — FLUCONAZOLE 150 MG PO TABS
150.0000 mg | ORAL_TABLET | Freq: Once | ORAL | 0 refills | Status: AC
  Filled 2021-06-14: qty 2, 2d supply, fill #0

## 2021-06-14 NOTE — Progress Notes (Signed)
Rx diflucan for yeast vag sx after abx tx

## 2021-07-01 ENCOUNTER — Other Ambulatory Visit: Payer: Self-pay

## 2021-08-03 ENCOUNTER — Other Ambulatory Visit: Payer: Self-pay

## 2021-08-03 ENCOUNTER — Other Ambulatory Visit: Payer: Self-pay | Admitting: Obstetrics and Gynecology

## 2021-08-03 MED ORDER — FAMOTIDINE 20 MG PO TABS
20.0000 mg | ORAL_TABLET | Freq: Two times a day (BID) | ORAL | 3 refills | Status: AC
  Filled 2021-08-03: qty 180, 90d supply, fill #0

## 2021-08-03 NOTE — Progress Notes (Signed)
Rx RF pepcid ?

## 2021-09-22 ENCOUNTER — Other Ambulatory Visit: Payer: Self-pay | Admitting: Obstetrics and Gynecology

## 2021-09-22 DIAGNOSIS — Z1509 Genetic susceptibility to other malignant neoplasm: Secondary | ICD-10-CM

## 2021-09-22 DIAGNOSIS — Z9189 Other specified personal risk factors, not elsewhere classified: Secondary | ICD-10-CM

## 2021-09-22 NOTE — Progress Notes (Signed)
Breast MRI order

## 2021-09-26 ENCOUNTER — Ambulatory Visit
Admission: EM | Admit: 2021-09-26 | Discharge: 2021-09-26 | Disposition: A | Discharge status: Home / Self Care | Payer: 59 | Attending: Internal Medicine | Admitting: Internal Medicine

## 2021-09-26 ENCOUNTER — Encounter: Payer: Self-pay | Admitting: Emergency Medicine

## 2021-09-26 DIAGNOSIS — B86 Scabies: Secondary | ICD-10-CM

## 2021-09-26 MED ORDER — PERMETHRIN 5 % EX CREA: TOPICAL_CREAM | CUTANEOUS | 0 refills | Status: DC

## 2021-09-26 NOTE — ED Triage Notes (Signed)
Pt presents with a rash on her stomach that she noticed yesterday. Her husband and daughter were dx with scabies 3 days ago.

## 2021-09-26 NOTE — ED Provider Notes (Signed)
[name]    CSN: [id number] Arrival date & time: 09/26/21  1715      History   Chief Complaint Chief Complaint  Patient presents with   Rash    HPI Shannon George is a 41 y.o. female who developed a rash on her thorax and abdomen since yesterday. Her husband and daughter have been diagnosed with scabies 3 days ago.     Past Medical History:  Diagnosis Date   BRCA1 gene mutation positive 2014   BRCA 1 valine substitution error. [facility]   Family history of breast cancer    GERD (gastroesophageal reflux disease) 2022   History of Papanicolaou smear of cervix 04/24/13; 06/04/14   -/+; neg   Migraine    Pap smear abnormality of cervix with ASCUS favoring dysplasia    Vitamin D deficiency     Patient Active Problem List   Diagnosis Date Noted   Vitamin D deficiency 04/06/2020   Abdominal pain, right upper quadrant 07/18/2014   BRCA gene positive 07/18/2014   BRCA1 gene mutation positive 05/08/2012    Past Surgical History:  Procedure Laterality Date   BREAST BIOPSY Left 06/20/2017   FIBROCYSTIC CHANGE of MRI bx, dumbell marker   CERVICAL BIOPSY  W/ LOOP ELECTRODE EXCISION  2001   [place]   CESAREAN SECTION  2006, 2008   LAPAROSCOPIC BILATERAL SALPINGECTOMY Bilateral 04/24/2019   Procedure: LAPAROSCOPIC BILATERAL SALPINGO- OOPHORECTOMY;  Surgeon: Malachy Mood, MD;  Location: [facility] ORS;  Service: Gynecology;  Laterality: Bilateral;   NOVASURE ABLATION  2013   [facility]   TUBAL LIGATION  2008    OB History     Gravida  2   Para  2   Term  2   Preterm      AB      Living  2      SAB      IAB      Ectopic      Multiple      Live Births  2        Obstetric Comments  1st Menstrual Cycle:  11 1st Pregnancy:  23          Home Medications    Prior to Admission medications   Medication Sig Start Date End Date Taking? Authorizing Provider  permethrin (ELIMITE) 5 % cream Apply to affected area once,leave it for 8 hours and wash  it after 09/26/21  Yes Rodriguez-Southworth, Sunday Spillers, PA-C  azithromycin (ZITHROMAX) 250 MG tablet Take two pills by mouth the first day, then one pill every day until completed 4/40/10   Copland, Elmo Putt B, PA-C  estradiol (ESTRACE) 1 MG tablet TAKE 1 TABLET (1 MG TOTAL) BY MOUTH DAILY. 04/07/21 27/2/53  Copland, Deirdre Evener, PA-C  famotidine (PEPCID) 20 MG tablet Take 1 tablet (20 mg total) by mouth 2 (two) times daily. 6/64/40   Copland, Deirdre Evener, PA-C  Halobetasol Prop-Tazarotene (DUOBRII) 0.01-0.045 % LOTN Apply to aa's psoriasis on the elbows QD. Once plaques have thinned switch back to Triamcinolone QD PRN flares. 03/07/21   Ralene Bathe, MD  ibuprofen (ADVIL) 800 MG tablet Take 1 tablet (800 mg total) by mouth every 8 (eight) hours as needed. 10/27/20   Sharion Balloon, NP  LORazepam (ATIVAN) 0.5 MG tablet Take 1 tablet (0.5 mg total) by mouth every 8 (eight) hours as needed for anxiety. 3/47/42   Copland, Deirdre Evener, PA-C  medroxyPROGESTERone (PROVERA) 5 MG tablet TAKE 1 TABLET (5 MG TOTAL) BY MOUTH DAILY. 04/07/21  55/7/32  Copland, Deirdre Evener, PA-C  SUMAtriptan (IMITREX) 100 MG tablet Take 1 tablet (100 mg total) by mouth every 2 (two) hours as needed for migraine. May repeat in 2 hours if headache persists or recurs. 12/31/18   Malachy Mood, MD  traZODone (DESYREL) 150 MG tablet Take 1 tablet (150 mg total) by mouth at bedtime. 20/2/54   Copland, Elmo Putt B, PA-C  triamcinolone cream (KENALOG) 0.1 % Apply 1 application topically as directed. Qd up to 5 days a week to aa elbows for psoriasis prn flares, avoid face, groin, axilla 12/24/19   Ralene Bathe, MD    Family History Family History  Problem Relation Age of Onset   Cancer Father 70       lung   Hypertension Mother    Breast cancer Mother        8 (medullary carcinoma, poorly differentiated) and 75   Breast cancer Paternal Aunt 22   Breast cancer Maternal Grandmother 2   Cancer Paternal Grandmother        BLADDER; KIDNEY    Cancer Maternal Grandfather        STOMACH    Social History Social History   Tobacco Use   Smoking status: Never   Smokeless tobacco: Never  Vaping Use   Vaping Use: Never used  Substance Use Topics   Alcohol use: No    Alcohol/week: 0.0 standard drinks   Drug use: No     Allergies   Amoxicillin and Penicillins   Review of Systems Review of Systems  Skin:  Positive for rash.       + itching    Physical Exam Triage Vital Signs ED Triage Vitals [09/26/21 1756]  Enc Vitals Group     BP 120/72     Pulse Rate 70     Resp 18     Temp 98.4 F (36.9 C)     Temp Source Oral     SpO2 99 %     Weight      Height      Head Circumference      Peak Flow      Pain Score      Pain Loc      Pain Edu?      Excl. in [place]?    No data found.  Updated Vital Signs BP 120/72 (BP Location: Left Arm)   Pulse 70   Temp 98.4 F (36.9 C) (Oral)   Resp 18   SpO2 99%   Visual Acuity Right Eye Distance:   Left Eye Distance:   Bilateral Distance:    Right Eye Near:   Left Eye Near:    Bilateral Near:     Physical Exam Vitals and nursing note reviewed.  Eyes:     General: No scleral icterus.    Conjunctiva/sclera: Conjunctivae normal.  Skin:    Comments: Has scattered papules under braw line area front and back and abdomen on waist line area  Neurological:     Mental Status: She is alert and oriented to person, place, and time.     Gait: Gait normal.  Psychiatric:        Mood and Affect: Mood normal.        Behavior: Behavior normal.        Thought Content: Thought content normal.        Judgment: Judgment normal.     UC Treatments / Results  Labs (all labs ordered are listed, but only abnormal results are displayed)  Labs Reviewed - No data to display  EKG   Radiology No results found.  Procedures Procedures (including critical care time)  Medications Ordered in UC Medications - No data to display  Initial Impression / Assessment and Plan / UC  Course  I have reviewed the triage vital signs and the nursing notes.  Scabies  Placed on elimite cream as noted . Is aware how to clean clothes and bedding   Final Clinical Impressions(s) / UC Diagnoses   Final diagnoses:  Scabies   Discharge Instructions   None    ED Prescriptions     Medication Sig Dispense Auth. Provider   permethrin (ELIMITE) 5 % cream Apply to affected area once,leave it for 8 hours and wash it after 60 g Rodriguez-Southworth, Sunday Spillers, PA-C      PDMP not reviewed this encounter.   Shelby Mattocks, [place] 09/26/21 1933

## 2021-10-05 ENCOUNTER — Telehealth: Payer: Self-pay | Admitting: Emergency Medicine

## 2021-10-05 MED ORDER — IVERMECTIN 3 MG PO TABS
200.0000 ug/kg | ORAL_TABLET | Freq: Once | ORAL | 0 refills | Status: AC
  Filled 2021-10-05: qty 11, 2d supply, fill #0

## 2021-10-05 NOTE — Telephone Encounter (Signed)
Patient called multiple times today.  States that the permethrin did not work, that the rash is continuing to spread and is requesting oral medication.  Will prescribe ivermectin 0.2 mg/kg per dose every 2 weeks for 2 doses.  This calculates to 16.5 mg per dose.  Eprescribed at the pharmacy on record.

## 2021-10-06 ENCOUNTER — Other Ambulatory Visit: Payer: Self-pay

## 2021-10-11 ENCOUNTER — Other Ambulatory Visit: Payer: Self-pay

## 2021-12-07 ENCOUNTER — Ambulatory Visit
Admission: RE | Admit: 2021-12-07 | Discharge: 2021-12-07 | Disposition: A | Discharge status: Home / Self Care | Payer: 59 | Source: Ambulatory Visit | Attending: Obstetrics and Gynecology | Admitting: Obstetrics and Gynecology

## 2021-12-07 DIAGNOSIS — Z9189 Other specified personal risk factors, not elsewhere classified: Secondary | ICD-10-CM | POA: Diagnosis not present

## 2021-12-07 DIAGNOSIS — Z1501 Genetic susceptibility to malignant neoplasm of breast: Secondary | ICD-10-CM | POA: Diagnosis not present

## 2021-12-07 DIAGNOSIS — R928 Other abnormal and inconclusive findings on diagnostic imaging of breast: Secondary | ICD-10-CM | POA: Diagnosis not present

## 2021-12-07 DIAGNOSIS — Z1239 Encounter for other screening for malignant neoplasm of breast: Secondary | ICD-10-CM | POA: Diagnosis not present

## 2021-12-07 DIAGNOSIS — Z1509 Genetic susceptibility to other malignant neoplasm: Secondary | ICD-10-CM | POA: Diagnosis not present

## 2021-12-07 MED ORDER — GADOBUTROL 1 MMOL/ML IV SOLN
8.0000 mL | Freq: Once | INTRAVENOUS | Status: AC | PRN
  Administered 2021-12-07: 8 mL via INTRAVENOUS

## 2021-12-30 ENCOUNTER — Other Ambulatory Visit: Payer: Self-pay

## 2021-12-30 DIAGNOSIS — L237 Allergic contact dermatitis due to plants, except food: Secondary | ICD-10-CM | POA: Diagnosis not present

## 2021-12-30 MED ORDER — TRIAMCINOLONE ACETONIDE 0.1 % EX CREA
TOPICAL_CREAM | CUTANEOUS | 0 refills | Status: DC
  Filled 2021-12-30: qty 30, 10d supply, fill #0

## 2022-01-12 ENCOUNTER — Ambulatory Visit (INDEPENDENT_AMBULATORY_CARE_PROVIDER_SITE_OTHER): Payer: 59

## 2022-01-12 ENCOUNTER — Other Ambulatory Visit: Payer: Self-pay

## 2022-01-12 DIAGNOSIS — N3001 Acute cystitis with hematuria: Secondary | ICD-10-CM | POA: Diagnosis not present

## 2022-01-12 DIAGNOSIS — R350 Frequency of micturition: Secondary | ICD-10-CM | POA: Diagnosis not present

## 2022-01-12 DIAGNOSIS — R300 Dysuria: Secondary | ICD-10-CM

## 2022-01-12 LAB — POCT URINALYSIS DIPSTICK
Blood, UA: POSITIVE
Glucose, UA: NEGATIVE
Nitrite, UA: NEGATIVE
Protein, UA: NEGATIVE
Spec Grav, UA: 1.01 (ref 1.010–1.025)
Urobilinogen, UA: 1 E.U./dL
pH, UA: 6.5 (ref 5.0–8.0)

## 2022-01-12 MED ORDER — CEPHALEXIN 500 MG PO CAPS
500.0000 mg | ORAL_CAPSULE | Freq: Four times a day (QID) | ORAL | 2 refills | Status: DC
  Filled 2022-01-12: qty 28, 7d supply, fill #0
  Filled 2022-01-17: qty 28, 7d supply, fill #1

## 2022-01-12 NOTE — Progress Notes (Signed)
Pt reports urine frequency and dysuria started this morning. Dip positive for Leucocytes and blood.

## 2022-01-16 LAB — URINE CULTURE

## 2022-01-17 ENCOUNTER — Other Ambulatory Visit: Payer: Self-pay

## 2022-01-30 ENCOUNTER — Other Ambulatory Visit: Payer: Self-pay

## 2022-01-30 DIAGNOSIS — J358 Other chronic diseases of tonsils and adenoids: Secondary | ICD-10-CM | POA: Diagnosis not present

## 2022-01-30 DIAGNOSIS — K219 Gastro-esophageal reflux disease without esophagitis: Secondary | ICD-10-CM | POA: Diagnosis not present

## 2022-01-30 MED ORDER — OMEPRAZOLE 40 MG PO CPDR
DELAYED_RELEASE_CAPSULE | ORAL | 12 refills | Status: DC
  Filled 2022-01-30: qty 30, 30d supply, fill #0

## 2022-03-08 ENCOUNTER — Ambulatory Visit (INDEPENDENT_AMBULATORY_CARE_PROVIDER_SITE_OTHER): Payer: 59 | Admitting: Dermatology

## 2022-03-08 DIAGNOSIS — L409 Psoriasis, unspecified: Secondary | ICD-10-CM

## 2022-03-08 DIAGNOSIS — D225 Melanocytic nevi of trunk: Secondary | ICD-10-CM

## 2022-03-08 DIAGNOSIS — Z79899 Other long term (current) drug therapy: Secondary | ICD-10-CM | POA: Diagnosis not present

## 2022-03-08 DIAGNOSIS — L578 Other skin changes due to chronic exposure to nonionizing radiation: Secondary | ICD-10-CM | POA: Diagnosis not present

## 2022-03-08 DIAGNOSIS — L814 Other melanin hyperpigmentation: Secondary | ICD-10-CM | POA: Diagnosis not present

## 2022-03-08 DIAGNOSIS — Z1283 Encounter for screening for malignant neoplasm of skin: Secondary | ICD-10-CM | POA: Diagnosis not present

## 2022-03-08 DIAGNOSIS — L821 Other seborrheic keratosis: Secondary | ICD-10-CM

## 2022-03-08 DIAGNOSIS — D229 Melanocytic nevi, unspecified: Secondary | ICD-10-CM | POA: Diagnosis not present

## 2022-03-08 DIAGNOSIS — D485 Neoplasm of uncertain behavior of skin: Secondary | ICD-10-CM

## 2022-03-08 MED ORDER — DUOBRII 0.01-0.045 % EX LOTN: TOPICAL_LOTION | CUTANEOUS | 6 refills | Status: AC

## 2022-03-08 NOTE — Progress Notes (Signed)
Follow-Up Visit   Subjective  Shannon George is a 41 y.o. female who presents for the following: Psoriasis (Currently using Duobrii for outbreaks on the elbow. Recently flared due to cooler weather). The patient presents for Total-Body Skin Exam (TBSE) for skin cancer screening and mole check.  The patient has spots, moles and lesions to be evaluated, some may be new or changing.  The following portions of the chart were reviewed this encounter and updated as appropriate:   Tobacco  Allergies  Meds  Problems  Med Hx  Surg Hx  Fam Hx     Review of Systems:  No other skin or systemic complaints except as noted in HPI or Assessment and Plan.  Objective  Well appearing patient in no apparent distress; mood and affect are within normal limits.  A full examination was performed including scalp, head, eyes, ears, nose, lips, neck, chest, axillae, abdomen, back, buttocks, bilateral upper extremities, bilateral lower extremities, hands, feet, fingers, toes, fingernails, and toenails. All findings within normal limits unless otherwise noted below.  B/L elbow Mild thickening of the R elbow.  Right Axilla Brown papule 0.6 cm    Assessment & Plan  Psoriasis B/L elbow Chronic and persistent condition with duration or expected duration over one year. Condition is symptomatic / bothersome to patient. Not to goal. Improved. Psoriasis is a chronic non-curable, but treatable genetic/hereditary disease that may have other systemic features affecting other organ systems such as joints (Psoriatic Arthritis). It is associated with an increased risk of inflammatory bowel disease, heart disease, non-alcoholic fatty liver disease, and depression.    Continue Duobrii lotion QD PRN flares.   Related Medications Halobetasol Prop-Tazarotene (DUOBRII) 0.01-0.045 % LOTN Apply to aa's psoriasis on the elbows QD. Once plaques have thinned switch back to Triamcinolone QD PRN flares.  Neoplasm of uncertain  behavior of skin Right Axilla Epidermal / dermal shaving  Lesion diameter (cm):  0.6 Informed consent: discussed and consent obtained   Timeout: patient name, date of birth, surgical site, and procedure verified   Procedure prep:  Patient was prepped and draped in usual sterile fashion Prep type:  Isopropyl alcohol Anesthesia: the lesion was anesthetized in a standard fashion   Anesthetic:  1% lidocaine w/ epinephrine 1-100,000 buffered w/ 8.4% NaHCO3 Instrument used: flexible razor blade   Hemostasis achieved with: pressure, aluminum chloride and electrodesiccation   Outcome: patient tolerated procedure well   Post-procedure details: sterile dressing applied and wound care instructions given   Dressing type: bandage and petrolatum    Specimen 1 - Surgical pathology Differential Diagnosis: D48.5 irritated nevus r/o dysplasia  Check Margins: No  Lentigines - Scattered tan macules - Due to sun exposure - Benign-appearing, observe - Recommend daily broad spectrum sunscreen SPF 30+ to sun-exposed areas, reapply every 2 hours as needed. - Call for any changes  Seborrheic Keratoses - Stuck-on, waxy, tan-brown papules and/or plaques  - Benign-appearing - Discussed benign etiology and prognosis. - Observe - Call for any changes  Melanocytic Nevi - Tan-brown and/or pink-flesh-colored symmetric macules and papules - Benign appearing on exam today - Observation - Call clinic for new or changing moles - Recommend daily use of broad spectrum spf 30+ sunscreen to sun-exposed areas.   Hemangiomas - Red papules - Discussed benign nature - Observe - Call for any changes  Actinic Damage - Chronic condition, secondary to cumulative UV/sun exposure - diffuse scaly erythematous macules with underlying dyspigmentation - Recommend daily broad spectrum sunscreen SPF 30+ to sun-exposed areas, reapply  every 2 hours as needed.  - Staying in the shade or wearing long sleeves, sun glasses  (UVA+UVB protection) and wide brim hats (4-inch brim around the entire circumference of the hat) are also recommended for sun protection.  - Call for new or changing lesions.  Skin cancer screening performed today.  Return in about 1 year (around 03/09/2023) for TBSE and psoriasis follow up .  Luther Redo, CMA, am acting as scribe for Sarina Ser, MD . Documentation: I have reviewed the above documentation for accuracy and completeness, and I agree with the above.  Sarina Ser, MD

## 2022-03-08 NOTE — Patient Instructions (Addendum)
Wound Care Instructions  Cleanse wound gently with soap and water once a day then pat dry with clean gauze. Apply a thin coat of Petrolatum (petroleum jelly, "Vaseline") over the wound (unless you have an allergy to this). We recommend that you use a new, sterile tube of Vaseline. Do not pick or remove scabs. Do not remove the yellow or white "healing tissue" from the base of the wound.  Cover the wound with fresh, clean, nonstick gauze and secure with paper tape. You may use Band-Aids in place of gauze and tape if the wound is small enough, but would recommend trimming much of the tape off as there is often too much. Sometimes Band-Aids can irritate the skin.  You should call the office for your biopsy report after 1 week if you have not already been contacted.  If you experience any problems, such as abnormal amounts of bleeding, swelling, significant bruising, significant pain, or evidence of infection, please call the office immediately.  FOR ADULT SURGERY PATIENTS: If you need something for pain relief you may take 1 extra strength Tylenol (acetaminophen) AND 2 Ibuprofen (200mg each) together every 4 hours as needed for pain. (do not take these if you are allergic to them or if you have a reason you should not take them.) Typically, you may only need pain medication for 1 to 3 days.     Due to recent changes in healthcare laws, you may see results of your pathology and/or laboratory studies on MyChart before the doctors have had a chance to review them. We understand that in some cases there may be results that are confusing or concerning to you. Please understand that not all results are received at the same time and often the doctors may need to interpret multiple results in order to provide you with the best plan of care or course of treatment. Therefore, we ask that you please give us 2 business days to thoroughly review all your results before contacting the office for clarification. Should  we see a critical lab result, you will be contacted sooner.   If You Need Anything After Your Visit  If you have any questions or concerns for your doctor, please call our main line at [phone] and press option 4 to reach your doctor's medical assistant. If no one answers, please leave a voicemail as directed and we will return your call as soon as possible. Messages left after 4 pm will be answered the following business day.   You may also send us a message via MyChart. We typically respond to MyChart messages within 1-2 business days.  For prescription refills, please ask your pharmacy to contact our office. Our fax number is [phone].  If you have an urgent issue when the clinic is closed that cannot wait until the next business day, you can page your doctor at the number below.    Please note that while we do our best to be available for urgent issues outside of office hours, we are not available 24/7.   If you have an urgent issue and are unable to reach us, you may choose to seek medical care at your doctor's office, retail clinic, urgent care center, or emergency room.  If you have a medical emergency, please immediately call 911 or go to the emergency department.  Pager Numbers  - Dr. Kowalski: [phone]  - Dr. Moye: [phone]  - Dr. Stewart: [phone]  In the event of inclement weather, please call our main line at   [phone] for an update on the status of any delays or closures.  Dermatology Medication Tips: Please keep the boxes that topical medications come in in order to help keep track of the instructions about where and how to use these. Pharmacies typically print the medication instructions only on the boxes and not directly on the medication tubes.   If your medication is too expensive, please contact our office at [phone] option 4 or send us a message through MyChart.   We are unable to tell what your co-pay for medications will be in  advance as this is different depending on your insurance coverage. However, we may be able to find a substitute medication at lower cost or fill out paperwork to get insurance to cover a needed medication.   If a prior authorization is required to get your medication covered by your insurance company, please allow us 1-2 business days to complete this process.  Drug prices often vary depending on where the prescription is filled and some pharmacies may offer cheaper prices.  The website www.goodrx.com contains coupons for medications through different pharmacies. The prices here do not account for what the cost may be with help from insurance (it may be cheaper with your insurance), but the website can give you the price if you did not use any insurance.  - You can print the associated coupon and take it with your prescription to the pharmacy.  - You may also stop by our office during regular business hours and pick up a GoodRx coupon card.  - If you need your prescription sent electronically to a different pharmacy, notify our office through [facility] MyChart or by phone at [phone] option 4.     Si Usted Necesita Algo Despus de Su Visita  Tambin puede enviarnos un mensaje a travs de MyChart. Por lo general respondemos a los mensajes de MyChart en el transcurso de 1 a 2 das hbiles.  Para renovar recetas, por favor pida a su farmacia que se ponga en contacto con nuestra oficina. Nuestro nmero de fax es el [phone].  Si tiene un asunto urgente cuando la clnica est cerrada y que no puede esperar hasta el siguiente da hbil, puede llamar/localizar a su doctor(a) al nmero que aparece a continuacin.   Por favor, tenga en cuenta que aunque hacemos todo lo posible para estar disponibles para asuntos urgentes fuera del horario de oficina, no estamos disponibles las 24 horas del da, los 7 das de la semana.   Si tiene un problema urgente y no puede comunicarse con nosotros, puede  optar por buscar atencin mdica  en el consultorio de su doctor(a), en una clnica privada, en un centro de atencin urgente o en una sala de emergencias.  Si tiene una emergencia mdica, por favor llame inmediatamente al 911 o vaya a la sala de emergencias.  Nmeros de bper  - Dr. Kowalski: [phone]  - Dra. Moye: [phone]  - Dra. Stewart: [phone]  En caso de inclemencias del tiempo, por favor llame a nuestra lnea principal al [phone] para una actualizacin sobre el estado de cualquier retraso o cierre.  Consejos para la medicacin en dermatologa: Por favor, guarde las cajas en las que vienen los medicamentos de uso tpico para ayudarle a seguir las instrucciones sobre dnde y cmo usarlos. Las farmacias generalmente imprimen las instrucciones del medicamento slo en las cajas y no directamente en los tubos del medicamento.   Si su medicamento es muy caro, por favor, pngase en contacto con   nuestra oficina llamando al [phone] y presione la opcin 4 o envenos un mensaje a travs de MyChart.   No podemos decirle cul ser su copago por los medicamentos por adelantado ya que esto es diferente dependiendo de la cobertura de su seguro. Sin embargo, es posible que podamos encontrar un medicamento sustituto a menor costo o llenar un formulario para que el seguro cubra el medicamento que se considera necesario.   Si se requiere una autorizacin previa para que su compaa de seguros cubra su medicamento, por favor permtanos de 1 a 2 das hbiles para completar este proceso.  Los precios de los medicamentos varan con frecuencia dependiendo del lugar de dnde se surte la receta y alguna farmacias pueden ofrecer precios ms baratos.  El sitio web www.goodrx.com tiene cupones para medicamentos de diferentes farmacias. Los precios aqu no tienen en cuenta lo que podra costar con la ayuda del seguro (puede ser ms barato con su seguro), pero el sitio web puede darle el  precio si no utiliz ningn seguro.  - Puede imprimir el cupn correspondiente y llevarlo con su receta a la farmacia.  - Tambin puede pasar por nuestra oficina durante el horario de atencin regular y recoger una tarjeta de cupones de GoodRx.  - Si necesita que su receta se enve electrnicamente a una farmacia diferente, informe a nuestra oficina a travs de MyChart de [facility] o por telfono llamando al [phone] y presione la opcin 4.  

## 2022-03-14 ENCOUNTER — Telehealth: Payer: Self-pay

## 2022-03-14 NOTE — Telephone Encounter (Signed)
-----   Message from Ralene Bathe, MD sent at 03/14/2022  2:53 PM EST ----- Diagnosis Skin , right axilla MELANOCYTIC NEVUS, INTRADERMAL TYPE   Benign mole  No further treatment needed

## 2022-03-14 NOTE — Telephone Encounter (Signed)
Advised pt of bx result/sh ?

## 2022-03-23 ENCOUNTER — Encounter: Payer: Self-pay | Admitting: Dermatology

## 2022-03-24 ENCOUNTER — Other Ambulatory Visit: Payer: Self-pay

## 2022-03-31 ENCOUNTER — Other Ambulatory Visit: Payer: Self-pay

## 2022-04-09 DIAGNOSIS — Z803 Family history of malignant neoplasm of breast: Secondary | ICD-10-CM | POA: Insufficient documentation

## 2022-04-09 DIAGNOSIS — Z9189 Other specified personal risk factors, not elsewhere classified: Secondary | ICD-10-CM | POA: Insufficient documentation

## 2022-04-09 DIAGNOSIS — R87810 Cervical high risk human papillomavirus (HPV) DNA test positive: Secondary | ICD-10-CM | POA: Insufficient documentation

## 2022-04-09 NOTE — Progress Notes (Unsigned)
PCP:  Chad Cordial, PA-C   No chief complaint on file.    HPI:      [name] is a 41 y.o. [phone] who LMP was No LMP recorded. Patient has had an ablation.Shannon George, presents today for her annual examination.  Her menses are absent due to endometrial ablation 2013/lap BSO 12/20 due to BRCA 1 mutation. No vaginal bleeding/pelvic pain. On estradiol 1 mg and provera 5 mg daily, occas vasomotor sx.   Sex activity: single partner, contraception - tubal ligation/BSO. No vaginal dryness, occas pelvic pain with sex. Last Pap: 04/07/21 Results were: no abnormalities /POS HPV DNA ; repeat pap due today Hx of STDs: HPV on cx/LEEP 2001  Last mammogram: 06/07/21 Results were normal, repeat in 12 months. Had neg breast MRI 8/23. There is a FH of breast cancer in her mom, MGM, and pat aunt. There is no FH of ovarian cancer.  PT IS BRCA 1 POS. The patient does do self-breast exams. She is doing yearly mammo and screening breast MRI (done 8/23), as well as Q6 months CBE. She is taking Vit D supp 7,000 IU daily due to deficiency in past, normal levels 12/22 (had gotten too high with 10,000 IUD dose). Is s/p BSO; aware of prophylactic mastectomy and tamoxifen recommendations.  No FH pancreatic cancer.   Tobacco use: The patient denies current or previous tobacco use. Alcohol use: none No drug use.  Exercise: moderately active  She does get adequate calcium and Vitamin D in her diet.  Normal fasting labs 12/22  Has insomnia, taking trazodone now several days wkly, was able to come off [place].  Takes lorazepam prn occas, doesn't need RF currently.    Past Medical History:  Diagnosis Date   BRCA1 gene mutation positive 2014   BRCA 1 valine substitution error. [facility]   Family history of breast cancer    GERD (gastroesophageal reflux disease) 2022   History of Papanicolaou smear of cervix 04/24/13; 06/04/14   -/+; neg   Migraine    Pap smear abnormality of cervix with ASCUS  favoring dysplasia    Vitamin D deficiency     Past Surgical History:  Procedure Laterality Date   BREAST BIOPSY Left 06/20/2017   FIBROCYSTIC CHANGE of MRI bx, dumbell marker   CERVICAL BIOPSY  W/ LOOP ELECTRODE EXCISION  2001   [place]   CESAREAN SECTION  2006, 2008   LAPAROSCOPIC BILATERAL SALPINGECTOMY Bilateral 04/24/2019   Procedure: LAPAROSCOPIC BILATERAL SALPINGO- OOPHORECTOMY;  Surgeon: Malachy Mood, MD;  Location: [facility] ORS;  Service: Gynecology;  Laterality: Bilateral;   [place] ABLATION  2013   [facility]   TUBAL LIGATION  2008    Family History  Problem Relation Age of Onset   Cancer Father 41       lung   Hypertension Mother    Breast cancer Mother        38 (medullary carcinoma, poorly differentiated) and 63   Breast cancer Paternal Aunt 26   Breast cancer Maternal Grandmother 53   Cancer Paternal Grandmother        BLADDER; KIDNEY   Cancer Maternal Grandfather        STOMACH    Social History   Socioeconomic History   Marital status: Married    Spouse name: [name]   Number of children: 2   Years of education: 13   Highest education level: Not on file  Occupational History   Occupation: [employer]    Comment: [place]  Tobacco Use   Smoking status: Never   Smokeless tobacco: Never  Vaping Use   Vaping Use: Never used  Substance and Sexual Activity   Alcohol use: No    Alcohol/week: 0.0 standard drinks of alcohol   Drug use: No   Sexual activity: Yes    Birth control/protection: None, Surgical    Comment: Ablation  Other Topics Concern   Not on file  Social History Narrative   Not on file   Social Determinants of Health   Financial Resource Strain: Not on file  Food Insecurity: Not on file  Transportation Needs: Not on file  Physical Activity: Not on file  Stress: Not on file  Social Connections: Not on file  Intimate Partner Violence: Not on file    No outpatient medications have been marked as taking for the 04/11/22 encounter  (Appointment) with Mirta Mally, Deirdre Evener, PA-C.     ROS:  Review of Systems  Constitutional:  Negative for fatigue, fever and unexpected weight change.  Respiratory:  Negative for cough, shortness of breath and wheezing.   Cardiovascular:  Negative for chest pain, palpitations and leg swelling.  Gastrointestinal:  Negative for blood in stool, constipation, diarrhea, nausea and vomiting.  Endocrine: Negative for cold intolerance, heat intolerance and polyuria.  Genitourinary:  Negative for dyspareunia, dysuria, flank pain, frequency, genital sores, hematuria, menstrual problem, pelvic pain, urgency, vaginal bleeding, vaginal discharge and vaginal pain.  Musculoskeletal:  Negative for back pain, joint swelling and myalgias.  Skin:  Negative for rash.  Neurological:  Negative for dizziness, syncope, light-headedness, numbness and headaches.  Hematological:  Negative for adenopathy.  Psychiatric/Behavioral:  Negative for agitation, confusion, sleep disturbance and suicidal ideas. The patient is not nervous/anxious.      Objective: There were no vitals taken for this visit.   Physical Exam Constitutional:      Appearance: She is well-developed.  Genitourinary:     Vulva normal.     Right Labia: No rash, tenderness or lesions.    Left Labia: No tenderness, lesions or rash.    No vaginal discharge, erythema or tenderness.      Right Adnexa: absent.    Right Adnexa: not tender and no mass present.    Left Adnexa: absent.    Left Adnexa: not tender and no mass present.    No cervical friability or polyp.     Uterus is not enlarged or tender.  Breasts:    Right: No mass, nipple discharge, skin change or tenderness.     Left: No mass, nipple discharge, skin change or tenderness.  Neck:     Thyroid: No thyromegaly.  Cardiovascular:     Rate and Rhythm: Normal rate and regular rhythm.     Heart sounds: Normal heart sounds. No murmur heard. Pulmonary:     Effort: Pulmonary effort is  normal.     Breath sounds: Normal breath sounds.  Abdominal:     Palpations: Abdomen is soft.     Tenderness: There is no abdominal tenderness. There is no guarding or rebound.  Musculoskeletal:        General: Normal range of motion.     Cervical back: Normal range of motion.  Lymphadenopathy:     Cervical: No cervical adenopathy.  Neurological:     General: No focal deficit present.     Mental Status: She is alert and oriented to person, place, and time.     Cranial Nerves: No cranial nerve deficit.  Skin:    General: Skin  is warm and dry.  Psychiatric:        Mood and Affect: Mood normal.        Behavior: Behavior normal.        Thought Content: Thought content normal.        Judgment: Judgment normal.  Vitals reviewed.     Assessment/Plan: Encounter for annual routine gynecological examination  Cervical cancer screening - Plan: Cytology - PAP  Screening for HPV (human papillomavirus) - Plan: Cytology - PAP  Encounter for screening mammogram for malignant neoplasm of breast - Plan: MM 3D SCREEN BREAST BILATERAL; pt to sched mammo  BRCA1 gene mutation positive - Plan: MM 3D SCREEN BREAST BILATERAL; pt aware of monthly SBE, Q6 mo CBE, yearly mammo and scr breast MRI. Will do MRI ref after mammo. Cont Vit D supp. Pt also aware of option for prophylactic mastectomy vs tamoxifen. Pt is s/p BSO. NO FH pancreatic ca.  Increased risk of breast cancer - Plan: MM 3D SCREEN BREAST BILATERAL  Hormone replacement therapy (HRT) - Plan: medroxyPROGESTERone (PROVERA) 5 MG tablet, estradiol (ESTRACE) 1 MG tablet; Rx RF.  Insomnia, unspecified type - Plan: traZODone (DESYREL) 150 MG tablet; Rx RF  Blood tests for routine general physical examination - Plan: Lipid panel, Comprehensive metabolic panel, Hemoglobin A1c  Screening cholesterol level - Plan: Lipid panel  Screening for diabetes mellitus - Plan: Hemoglobin A1c  Vitamin D deficiency - Plan: Vitamin D (25 hydroxy)  No orders  of the defined types were placed in this encounter.           GYN counsel breast self exam, mammography screening, adequate intake of calcium and vitamin D, diet and exercise     F/U  No follow-ups on file.  Yelena Metzer B. Antoinette Borgwardt, PA-C 04/09/2022 8:49 AM

## 2022-04-11 ENCOUNTER — Encounter: Payer: Self-pay | Admitting: Obstetrics and Gynecology

## 2022-04-11 ENCOUNTER — Other Ambulatory Visit (HOSPITAL_COMMUNITY)
Admission: RE | Admit: 2022-04-11 | Discharge: 2022-04-11 | Disposition: A | Discharge status: Home / Self Care | Payer: 59 | Source: Ambulatory Visit | Attending: Obstetrics and Gynecology | Admitting: Obstetrics and Gynecology

## 2022-04-11 ENCOUNTER — Other Ambulatory Visit: Payer: Self-pay

## 2022-04-11 ENCOUNTER — Ambulatory Visit (INDEPENDENT_AMBULATORY_CARE_PROVIDER_SITE_OTHER): Payer: 59 | Admitting: Obstetrics and Gynecology

## 2022-04-11 VITALS — BP 110/64 | Ht 64.0 in | Wt 199.0 lb

## 2022-04-11 DIAGNOSIS — Z1151 Encounter for screening for human papillomavirus (HPV): Secondary | ICD-10-CM | POA: Diagnosis not present

## 2022-04-11 DIAGNOSIS — Z124 Encounter for screening for malignant neoplasm of cervix: Secondary | ICD-10-CM | POA: Insufficient documentation

## 2022-04-11 DIAGNOSIS — E559 Vitamin D deficiency, unspecified: Secondary | ICD-10-CM | POA: Diagnosis not present

## 2022-04-11 DIAGNOSIS — Z803 Family history of malignant neoplasm of breast: Secondary | ICD-10-CM

## 2022-04-11 DIAGNOSIS — Z1501 Genetic susceptibility to malignant neoplasm of breast: Secondary | ICD-10-CM

## 2022-04-11 DIAGNOSIS — F419 Anxiety disorder, unspecified: Secondary | ICD-10-CM | POA: Diagnosis not present

## 2022-04-11 DIAGNOSIS — Z9189 Other specified personal risk factors, not elsewhere classified: Secondary | ICD-10-CM

## 2022-04-11 DIAGNOSIS — Z1231 Encounter for screening mammogram for malignant neoplasm of breast: Secondary | ICD-10-CM

## 2022-04-11 DIAGNOSIS — Z1509 Genetic susceptibility to other malignant neoplasm: Secondary | ICD-10-CM

## 2022-04-11 DIAGNOSIS — Z79890 Hormone replacement therapy (postmenopausal): Secondary | ICD-10-CM

## 2022-04-11 DIAGNOSIS — Z01419 Encounter for gynecological examination (general) (routine) without abnormal findings: Secondary | ICD-10-CM

## 2022-04-11 DIAGNOSIS — N951 Menopausal and female climacteric states: Secondary | ICD-10-CM | POA: Diagnosis not present

## 2022-04-11 DIAGNOSIS — R87810 Cervical high risk human papillomavirus (HPV) DNA test positive: Secondary | ICD-10-CM | POA: Insufficient documentation

## 2022-04-11 DIAGNOSIS — Z01411 Encounter for gynecological examination (general) (routine) with abnormal findings: Secondary | ICD-10-CM

## 2022-04-11 DIAGNOSIS — G4700 Insomnia, unspecified: Secondary | ICD-10-CM | POA: Diagnosis not present

## 2022-04-11 MED ORDER — LORAZEPAM 0.5 MG PO TABS
0.5000 mg | ORAL_TABLET | Freq: Three times a day (TID) | ORAL | 0 refills | Status: AC | PRN
  Filled 2022-04-11: qty 30, 10d supply, fill #0

## 2022-04-11 MED ORDER — ESTRADIOL 1 MG PO TABS
ORAL_TABLET | Freq: Every day | ORAL | 3 refills | Status: DC
  Filled 2022-04-11: qty 90, fill #0

## 2022-04-11 MED ORDER — PROGESTERONE 200 MG PO CAPS
ORAL_CAPSULE | ORAL | 3 refills | Status: DC
  Filled 2022-04-11: qty 90, 90d supply, fill #0
  Filled 2022-04-11: qty 24, 28d supply, fill #0

## 2022-04-11 NOTE — Patient Instructions (Signed)
I value your feedback and you entrusting us with your care. If you get a [facility] patient survey, I would appreciate you taking the time to let us know about your experience today. Thank you!  [facility]: [phone]      

## 2022-04-12 LAB — VITAMIN D 25 HYDROXY (VIT D DEFICIENCY, FRACTURES): Vit D, 25-Hydroxy: 38.8 ng/mL (ref 30.0–100.0)

## 2022-04-12 LAB — CYTOLOGY - PAP
Adequacy: ABSENT
Comment: NEGATIVE
Diagnosis: NEGATIVE
High risk HPV: NEGATIVE

## 2022-04-19 ENCOUNTER — Other Ambulatory Visit: Payer: Self-pay

## 2022-04-19 ENCOUNTER — Encounter: Payer: Self-pay | Admitting: Pharmacist

## 2022-06-08 ENCOUNTER — Ambulatory Visit
Admission: RE | Admit: 2022-06-08 | Discharge: 2022-06-08 | Disposition: A | Discharge status: Home / Self Care | Payer: 59 | Source: Ambulatory Visit | Attending: Obstetrics and Gynecology | Admitting: Obstetrics and Gynecology

## 2022-06-08 DIAGNOSIS — Z1231 Encounter for screening mammogram for malignant neoplasm of breast: Secondary | ICD-10-CM | POA: Diagnosis not present

## 2022-06-08 DIAGNOSIS — Z803 Family history of malignant neoplasm of breast: Secondary | ICD-10-CM | POA: Insufficient documentation

## 2022-06-08 DIAGNOSIS — Z9189 Other specified personal risk factors, not elsewhere classified: Secondary | ICD-10-CM | POA: Diagnosis not present

## 2022-06-08 DIAGNOSIS — Z1501 Genetic susceptibility to malignant neoplasm of breast: Secondary | ICD-10-CM | POA: Insufficient documentation

## 2022-06-08 DIAGNOSIS — Z1509 Genetic susceptibility to other malignant neoplasm: Secondary | ICD-10-CM | POA: Diagnosis not present

## 2022-08-10 ENCOUNTER — Other Ambulatory Visit: Payer: Self-pay | Admitting: Obstetrics and Gynecology

## 2022-08-10 DIAGNOSIS — Z803 Family history of malignant neoplasm of breast: Secondary | ICD-10-CM

## 2022-08-10 DIAGNOSIS — Z9189 Other specified personal risk factors, not elsewhere classified: Secondary | ICD-10-CM

## 2022-08-10 DIAGNOSIS — Z1509 Genetic susceptibility to other malignant neoplasm: Secondary | ICD-10-CM

## 2022-08-10 DIAGNOSIS — Z1501 Genetic susceptibility to malignant neoplasm of breast: Secondary | ICD-10-CM

## 2022-11-14 ENCOUNTER — Other Ambulatory Visit: Payer: 59

## 2022-11-27 ENCOUNTER — Other Ambulatory Visit: Payer: Self-pay | Admitting: Obstetrics and Gynecology

## 2022-11-27 DIAGNOSIS — L659 Nonscarring hair loss, unspecified: Secondary | ICD-10-CM

## 2022-11-27 DIAGNOSIS — E559 Vitamin D deficiency, unspecified: Secondary | ICD-10-CM

## 2022-11-27 DIAGNOSIS — Z1329 Encounter for screening for other suspected endocrine disorder: Secondary | ICD-10-CM

## 2022-11-27 NOTE — Progress Notes (Signed)
Lab orders

## 2022-11-28 ENCOUNTER — Other Ambulatory Visit: Payer: 59

## 2022-11-28 DIAGNOSIS — L659 Nonscarring hair loss, unspecified: Secondary | ICD-10-CM | POA: Diagnosis not present

## 2022-11-28 DIAGNOSIS — E559 Vitamin D deficiency, unspecified: Secondary | ICD-10-CM

## 2022-11-28 DIAGNOSIS — Z1329 Encounter for screening for other suspected endocrine disorder: Secondary | ICD-10-CM | POA: Diagnosis not present

## 2022-11-29 LAB — TSH+FREE T4
Free T4: 1.26 ng/dL (ref 0.82–1.77)
TSH: 3.03 u[IU]/mL (ref 0.450–4.500)

## 2022-11-29 LAB — VITAMIN D 25 HYDROXY (VIT D DEFICIENCY, FRACTURES): Vit D, 25-Hydroxy: 39.5 ng/mL (ref 30.0–100.0)

## 2022-12-21 ENCOUNTER — Encounter: Payer: Self-pay | Admitting: Obstetrics and Gynecology

## 2022-12-26 ENCOUNTER — Ambulatory Visit: Admission: RE | Admit: 2022-12-26 | Payer: 59 | Source: Ambulatory Visit

## 2022-12-26 DIAGNOSIS — Z1239 Encounter for other screening for malignant neoplasm of breast: Secondary | ICD-10-CM | POA: Diagnosis not present

## 2022-12-26 DIAGNOSIS — Z1501 Genetic susceptibility to malignant neoplasm of breast: Secondary | ICD-10-CM

## 2022-12-26 DIAGNOSIS — Z9189 Other specified personal risk factors, not elsewhere classified: Secondary | ICD-10-CM

## 2022-12-26 DIAGNOSIS — Z803 Family history of malignant neoplasm of breast: Secondary | ICD-10-CM

## 2022-12-26 MED ORDER — GADOPICLENOL 0.5 MMOL/ML IV SOLN
9.0000 mL | Freq: Once | INTRAVENOUS | Status: AC | PRN
  Administered 2022-12-26: 9 mL via INTRAVENOUS

## 2023-03-21 ENCOUNTER — Ambulatory Visit: Payer: 59 | Admitting: Dermatology

## 2023-04-16 NOTE — Progress Notes (Unsigned)
PCP:  Rica Records, PA-C   No chief complaint on file.    HPI:      [name] is a 42 y.o. [phone] who LMP was No LMP recorded. Patient has had an ablation.Shannon George, presents today for her annual examination.  Her menses are absent due to endometrial ablation 2013/lap BSO 12/20 due to BRCA 1 mutation. No vaginal bleeding/pelvic pain. On estradiol 1 mg and provera 5 mg daily, no vasomotor sx.   Sex activity: single partner, contraception - tubal ligation/BSO. Does have vaginal dryness, lubricants irritate her more. Hasn't tried coconut oil.  Last Pap: 04/11/22 Results were NILM/neg HPV DNA. 04/07/21 Results were: no abnormalities /POS HPV DNA ; repeat pap due today Hx of STDs: HPV on cx/LEEP 2001  Last mammogram: 06/08/22 Results were normal, repeat in 12 months. Had neg breast MRI 8/24. There is a FH of breast cancer in her mom, MGM, and pat aunt. There is no FH of ovarian cancer.  PT IS BRCA 1 POS. The patient does do self-breast exams. She is doing yearly mammo and screening breast MRI (done 8/24), as well as Q6 months CBE. She is taking Vit D supp 10,000 IU daily due to deficiency in past, normal levels 7/24. Is s/p BSO; aware of prophylactic mastectomy and tamoxifen recommendations.  No FH pancreatic cancer.   Tobacco use: The patient denies current or previous tobacco use. Alcohol use: none No drug use.  Exercise: min active  She does get adequate calcium and Vitamin D in her diet.  Normal fasting labs 12/22  Has insomnia, taking trazodone now several days wkly, was able to come off [employer]. Doesn't need RF today. Takes lorazepam prn occas, needs RF    Past Medical History:  Diagnosis Date   BRCA1 gene mutation positive 2014   BRCA 1 valine substitution error. [facility]   Family history of breast cancer    GERD (gastroesophageal reflux disease) 2022   History of Papanicolaou smear of cervix 04/24/13; 06/04/14   -/+; neg   Migraine    Pap smear abnormality of  cervix with ASCUS favoring dysplasia    Vitamin D deficiency     Past Surgical History:  Procedure Laterality Date   BREAST BIOPSY Left 06/20/2017   FIBROCYSTIC CHANGE of MRI bx, dumbell marker   CERVICAL BIOPSY  W/ LOOP ELECTRODE EXCISION  2001   [facility]   CESAREAN SECTION  2006, 2008   LAPAROSCOPIC BILATERAL SALPINGECTOMY Bilateral 04/24/2019   Procedure: LAPAROSCOPIC BILATERAL SALPINGO- OOPHORECTOMY;  Surgeon: Vena Austria, MD;  Location: [facility] ORS;  Service: Gynecology;  Laterality: Bilateral;   NOVASURE ABLATION  2013   [facility]   TUBAL LIGATION  2008    Family History  Problem Relation Age of Onset   Cancer Father 49       lung   Hypertension Mother    Breast cancer Mother        50 (medullary carcinoma, poorly differentiated) and 50   Breast cancer Paternal Aunt 60   Breast cancer Maternal Grandmother 76   Cancer Paternal Grandmother        BLADDER; KIDNEY   Cancer Maternal Grandfather        STOMACH    Social History   Socioeconomic History   Marital status: Married    Spouse name: [name]   Number of children: 2   Years of education: 13   Highest education level: Not on file  Occupational History   Occupation: [employer]  Comment: [employer]  Tobacco Use   Smoking status: Never   Smokeless tobacco: Never  Vaping Use   Vaping status: Never Used  Substance and Sexual Activity   Alcohol use: No    Alcohol/week: 0.0 standard drinks of alcohol   Drug use: No   Sexual activity: Yes    Birth control/protection: None, Surgical    Comment: Tubal Ligation  Other Topics Concern   Not on file  Social History Narrative   Not on file   Social Determinants of Health   Financial Resource Strain: Not on file  Food Insecurity: Not on file  Transportation Needs: Not on file  Physical Activity: Not on file  Stress: Not on file  Social Connections: Not on file  Intimate Partner Violence: Not on file    No outpatient medications have been marked as taking  for the 04/17/23 encounter (Appointment) with Jatavius Ellenwood, Ilona Sorrel, PA-C.     ROS:  Review of Systems  Constitutional:  Negative for fatigue, fever and unexpected weight change.  Respiratory:  Negative for cough, shortness of breath and wheezing.   Cardiovascular:  Negative for chest pain, palpitations and leg swelling.  Gastrointestinal:  Negative for blood in stool, constipation, diarrhea, nausea and vomiting.  Endocrine: Negative for cold intolerance, heat intolerance and polyuria.  Genitourinary:  Negative for dyspareunia, dysuria, flank pain, frequency, genital sores, hematuria, menstrual problem, pelvic pain, urgency, vaginal bleeding, vaginal discharge and vaginal pain.  Musculoskeletal:  Negative for back pain, joint swelling and myalgias.  Skin:  Negative for rash.  Neurological:  Negative for dizziness, syncope, light-headedness, numbness and headaches.  Hematological:  Negative for adenopathy.  Psychiatric/Behavioral:  Negative for agitation, confusion, sleep disturbance and suicidal ideas. The patient is not nervous/anxious.      Objective: There were no vitals taken for this visit.   Physical Exam Constitutional:      Appearance: She is well-developed.  Genitourinary:     Vulva normal.     Right Labia: No rash, tenderness or lesions.    Left Labia: No tenderness, lesions or rash.    No vaginal discharge, erythema or tenderness.      Right Adnexa: absent.    Right Adnexa: not tender and no mass present.    Left Adnexa: absent.    Left Adnexa: not tender and no mass present.    No cervical friability or polyp.     Uterus is not enlarged or tender.  Breasts:    Right: No mass, nipple discharge, skin change or tenderness.     Left: No mass, nipple discharge, skin change or tenderness.  Neck:     Thyroid: No thyromegaly.  Cardiovascular:     Rate and Rhythm: Normal rate and regular rhythm.     Heart sounds: Normal heart sounds. No murmur heard. Pulmonary:      Effort: Pulmonary effort is normal.     Breath sounds: Normal breath sounds.  Abdominal:     Palpations: Abdomen is soft.     Tenderness: There is no abdominal tenderness. There is no guarding or rebound.  Musculoskeletal:        General: Normal range of motion.     Cervical back: Normal range of motion.  Lymphadenopathy:     Cervical: No cervical adenopathy.  Neurological:     General: No focal deficit present.     Mental Status: She is alert and oriented to person, place, and time.     Cranial Nerves: No cranial nerve deficit.  Skin:  General: Skin is warm and dry.  Psychiatric:        Mood and Affect: Mood normal.        Behavior: Behavior normal.        Thought Content: Thought content normal.        Judgment: Judgment normal.  Vitals reviewed.     Assessment/Plan: Encounter for annual routine gynecological examination  Cervical cancer screening - Plan: Cytology - PAP  Screening for HPV (human papillomavirus) - Plan: Cytology - PAP  Cervical high risk human papillomavirus (HPV) DNA test positive - Plan: Cytology - PAP; repeat pap today. Will f/u with results if abn.   Encounter for screening mammogram for malignant neoplasm of breast - Plan: MM 3D SCREEN BREAST BILATERAL; pt to schedule mammo  BRCA1 gene mutation positive - Plan: MM 3D SCREEN BREAST BILATERAL; pt aware of monthly SBE, Q6 mo CBE, yearly mammo and scr breast MRI. Will do MRI ref after mammo. Cont Vit D supp. Pt also aware of option for prophylactic mastectomy vs tamoxifen. Pt is s/p BSO. No FH pancreatic ca.  Family history of breast cancer - Plan: MM 3D SCREEN BREAST BILATERAL  Increased risk of breast cancer - Plan: MM 3D SCREEN BREAST BILATERAL  Vasomotor symptoms due to menopause - Plan: estradiol (ESTRACE) 1 MG tablet, progesterone (PROMETRIUM) 200 MG capsule; Rx RF estradiol. Will change to prometrium from provera due to my preference in prog options. Rx eRxd. F/u prn.   Hormone replacement  therapy (HRT) - Plan: estradiol (ESTRACE) 1 MG tablet, progesterone (PROMETRIUM) 200 MG capsule  Vitamin D deficiency - Plan: Vitamin D (25 hydroxy), Vitamin D (25 hydroxy); recheck today.   Insomnia, unspecified type; will call for trazodone RF prn.   Anxiety - Plan: LORazepam (ATIVAN) 0.5 MG tablet; takes sparingly; Rx RF.    No orders of the defined types were placed in this encounter.           GYN counsel breast self exam, mammography screening, adequate intake of calcium and vitamin D, diet and exercise     F/U  No follow-ups on file./ 6 months CBE  Shalunda Lindh B. Lenia Housley, PA-C 04/16/2023 5:10 PM

## 2023-04-17 ENCOUNTER — Other Ambulatory Visit: Payer: Self-pay

## 2023-04-17 ENCOUNTER — Other Ambulatory Visit (HOSPITAL_COMMUNITY)
Admission: RE | Admit: 2023-04-17 | Discharge: 2023-04-17 | Disposition: A | Discharge status: Home / Self Care | Payer: 59 | Source: Ambulatory Visit | Attending: Obstetrics and Gynecology | Admitting: Obstetrics and Gynecology

## 2023-04-17 ENCOUNTER — Ambulatory Visit (INDEPENDENT_AMBULATORY_CARE_PROVIDER_SITE_OTHER): Payer: 59 | Admitting: Obstetrics and Gynecology

## 2023-04-17 ENCOUNTER — Encounter: Payer: Self-pay | Admitting: Obstetrics and Gynecology

## 2023-04-17 VITALS — BP 105/63 | HR 66 | Ht 64.0 in | Wt 210.0 lb

## 2023-04-17 DIAGNOSIS — R87810 Cervical high risk human papillomavirus (HPV) DNA test positive: Secondary | ICD-10-CM | POA: Diagnosis not present

## 2023-04-17 DIAGNOSIS — Z131 Encounter for screening for diabetes mellitus: Secondary | ICD-10-CM

## 2023-04-17 DIAGNOSIS — Z1509 Genetic susceptibility to other malignant neoplasm: Secondary | ICD-10-CM

## 2023-04-17 DIAGNOSIS — Z803 Family history of malignant neoplasm of breast: Secondary | ICD-10-CM

## 2023-04-17 DIAGNOSIS — Z13220 Encounter for screening for lipoid disorders: Secondary | ICD-10-CM

## 2023-04-17 DIAGNOSIS — Z1151 Encounter for screening for human papillomavirus (HPV): Secondary | ICD-10-CM | POA: Diagnosis not present

## 2023-04-17 DIAGNOSIS — N3001 Acute cystitis with hematuria: Secondary | ICD-10-CM | POA: Diagnosis not present

## 2023-04-17 DIAGNOSIS — Z79890 Hormone replacement therapy (postmenopausal): Secondary | ICD-10-CM

## 2023-04-17 DIAGNOSIS — Z9189 Other specified personal risk factors, not elsewhere classified: Secondary | ICD-10-CM

## 2023-04-17 DIAGNOSIS — Z01411 Encounter for gynecological examination (general) (routine) with abnormal findings: Secondary | ICD-10-CM | POA: Diagnosis not present

## 2023-04-17 DIAGNOSIS — F419 Anxiety disorder, unspecified: Secondary | ICD-10-CM

## 2023-04-17 DIAGNOSIS — E559 Vitamin D deficiency, unspecified: Secondary | ICD-10-CM

## 2023-04-17 DIAGNOSIS — Z0000 Encounter for general adult medical examination without abnormal findings: Secondary | ICD-10-CM

## 2023-04-17 DIAGNOSIS — Z1501 Genetic susceptibility to malignant neoplasm of breast: Secondary | ICD-10-CM

## 2023-04-17 DIAGNOSIS — Z124 Encounter for screening for malignant neoplasm of cervix: Secondary | ICD-10-CM

## 2023-04-17 DIAGNOSIS — N951 Menopausal and female climacteric states: Secondary | ICD-10-CM

## 2023-04-17 DIAGNOSIS — Z1231 Encounter for screening mammogram for malignant neoplasm of breast: Secondary | ICD-10-CM

## 2023-04-17 DIAGNOSIS — Z01419 Encounter for gynecological examination (general) (routine) without abnormal findings: Secondary | ICD-10-CM

## 2023-04-17 LAB — POCT URINALYSIS DIPSTICK
Bilirubin, UA: NEGATIVE
Glucose, UA: NEGATIVE
Ketones, UA: NEGATIVE
Nitrite, UA: NEGATIVE
Protein, UA: NEGATIVE
Spec Grav, UA: 1.015 (ref 1.010–1.025)
pH, UA: 6 (ref 5.0–8.0)

## 2023-04-17 MED ORDER — PROGESTERONE 200 MG PO CAPS
200.0000 mg | ORAL_CAPSULE | Freq: Every evening | ORAL | 3 refills | Status: DC
  Filled 2023-04-17: qty 72, 84d supply, fill #0

## 2023-04-17 MED ORDER — NITROFURANTOIN MONOHYD MACRO 100 MG PO CAPS
100.0000 mg | ORAL_CAPSULE | Freq: Two times a day (BID) | ORAL | 0 refills | Status: AC
Start: 2023-04-17 — End: 2023-04-24
  Filled 2023-04-17: qty 14, 7d supply, fill #0

## 2023-04-17 MED ORDER — ESTRADIOL 1 MG PO TABS
1.0000 mg | ORAL_TABLET | Freq: Every day | ORAL | 3 refills | Status: DC
  Filled 2023-04-17: qty 90, 90d supply, fill #0

## 2023-04-17 NOTE — Addendum Note (Signed)
Addended by: Althea Grimmer B on: 04/17/2023 09:46 AM   Modules accepted: Orders

## 2023-04-17 NOTE — Patient Instructions (Signed)
I value your feedback and you entrusting us with your care. If you get a [facility] patient survey, I would appreciate you taking the time to let us know about your experience today. Thank you! ? ? ?

## 2023-04-18 LAB — COMPREHENSIVE METABOLIC PANEL
ALT: 8 [IU]/L (ref 0–32)
AST: 15 [IU]/L (ref 0–40)
Albumin: 4.2 g/dL (ref 3.9–4.9)
Alkaline Phosphatase: 72 [IU]/L (ref 44–121)
BUN/Creatinine Ratio: 22 (ref 9–23)
BUN: 19 mg/dL (ref 6–24)
Bilirubin Total: 0.5 mg/dL (ref 0.0–1.2)
CO2: 26 mmol/L (ref 20–29)
Calcium: 9.4 mg/dL (ref 8.7–10.2)
Chloride: 103 mmol/L (ref 96–106)
Creatinine, Ser: 0.87 mg/dL (ref 0.57–1.00)
Globulin, Total: 2.7 g/dL (ref 1.5–4.5)
Glucose: 90 mg/dL (ref 70–99)
Potassium: 4.8 mmol/L (ref 3.5–5.2)
Sodium: 142 mmol/L (ref 134–144)
Total Protein: 6.9 g/dL (ref 6.0–8.5)
eGFR: 85 mL/min/{1.73_m2} (ref >59)

## 2023-04-18 LAB — LIPID PANEL
Chol/HDL Ratio: 2.7 {ratio} (ref 0.0–4.4)
Cholesterol, Total: 170 mg/dL (ref 100–199)
HDL: 63 mg/dL (ref >39)
LDL Chol Calc (NIH): 94 mg/dL (ref 0–99)
Triglycerides: 65 mg/dL (ref 0–149)
VLDL Cholesterol Cal: 13 mg/dL (ref 5–40)

## 2023-04-18 LAB — HEMOGLOBIN A1C
Est. average glucose Bld gHb Est-mCnc: 117 mg/dL
Hgb A1c MFr Bld: 5.7 % — ABNORMAL HIGH (ref 4.8–5.6)

## 2023-04-18 LAB — CYTOLOGY - PAP
Comment: NEGATIVE
Diagnosis: NEGATIVE
High risk HPV: POSITIVE — AB

## 2023-04-21 LAB — URINE CULTURE

## 2023-04-27 ENCOUNTER — Other Ambulatory Visit: Payer: Self-pay

## 2023-05-14 ENCOUNTER — Ambulatory Visit: Payer: 59 | Admitting: Obstetrics

## 2023-05-17 ENCOUNTER — Ambulatory Visit: Payer: 59 | Admitting: Obstetrics & Gynecology

## 2023-05-17 DIAGNOSIS — B977 Papillomavirus as the cause of diseases classified elsewhere: Secondary | ICD-10-CM

## 2023-05-23 ENCOUNTER — Ambulatory Visit: Payer: 59

## 2023-05-23 VITALS — BP 111/81 | HR 76 | Ht 64.0 in | Wt 170.0 lb

## 2023-05-23 DIAGNOSIS — Z23 Encounter for immunization: Secondary | ICD-10-CM

## 2023-05-23 NOTE — Progress Notes (Signed)
    NURSE VISIT NOTE  Subjective:    Patient ID: Shannon George, female    DOB: [birth year], 43 y.o.   MRN: [id number]  HPI  Patient is a 43 y.o. G74P2002 female Married Caucasian female who presents for her first Gardasil injection. Order to administer given by Sofia Dunn, MD on 05/23/2023.   Objective:    There were no vitals taken for this visit.  43 y.o. LMP:  n/a  Contraception:  Sterilization for Men and Women Given by: Rian Chafe, CMA Site:  Right deltoid  Lab Review  No results found for any visits on 05/23/23.    Assessment:   No diagnosis found.   Plan:   Patient will return in 2 months for second injection.    Rian Chafe, CMA

## 2023-06-04 DIAGNOSIS — H52223 Regular astigmatism, bilateral: Secondary | ICD-10-CM | POA: Diagnosis not present

## 2023-06-04 DIAGNOSIS — H5213 Myopia, bilateral: Secondary | ICD-10-CM | POA: Diagnosis not present

## 2023-06-04 DIAGNOSIS — H524 Presbyopia: Secondary | ICD-10-CM | POA: Diagnosis not present

## 2023-06-12 ENCOUNTER — Ambulatory Visit
Admission: RE | Admit: 2023-06-12 | Discharge: 2023-06-12 | Disposition: A | Discharge status: Home / Self Care | Payer: 59 | Source: Ambulatory Visit | Attending: Obstetrics and Gynecology | Admitting: Obstetrics and Gynecology

## 2023-06-12 DIAGNOSIS — Z1501 Genetic susceptibility to malignant neoplasm of breast: Secondary | ICD-10-CM | POA: Insufficient documentation

## 2023-06-12 DIAGNOSIS — Z1231 Encounter for screening mammogram for malignant neoplasm of breast: Secondary | ICD-10-CM | POA: Diagnosis not present

## 2023-06-12 DIAGNOSIS — Z1509 Genetic susceptibility to other malignant neoplasm: Secondary | ICD-10-CM | POA: Insufficient documentation

## 2023-06-12 DIAGNOSIS — Z803 Family history of malignant neoplasm of breast: Secondary | ICD-10-CM | POA: Diagnosis not present

## 2023-06-13 ENCOUNTER — Ambulatory Visit: Payer: 59 | Admitting: Obstetrics

## 2023-06-14 ENCOUNTER — Encounter: Payer: Self-pay | Admitting: Obstetrics and Gynecology

## 2023-07-16 ENCOUNTER — Ambulatory Visit (INDEPENDENT_AMBULATORY_CARE_PROVIDER_SITE_OTHER): Payer: Commercial Managed Care - PPO | Admitting: Podiatry

## 2023-07-16 ENCOUNTER — Other Ambulatory Visit: Payer: Self-pay

## 2023-07-16 ENCOUNTER — Ambulatory Visit (INDEPENDENT_AMBULATORY_CARE_PROVIDER_SITE_OTHER)

## 2023-07-16 ENCOUNTER — Encounter: Payer: Self-pay | Admitting: Podiatry

## 2023-07-16 DIAGNOSIS — M722 Plantar fascial fibromatosis: Secondary | ICD-10-CM

## 2023-07-16 MED ORDER — MELOXICAM 15 MG PO TABS
15.0000 mg | ORAL_TABLET | Freq: Every day | ORAL | 3 refills | Status: DC
Start: 2023-07-16 — End: 2023-11-22
  Filled 2023-07-16: qty 30, 30d supply, fill #0

## 2023-07-16 MED ORDER — METHYLPREDNISOLONE 4 MG PO TBPK
ORAL_TABLET | ORAL | 0 refills | Status: AC
  Filled 2023-07-16: qty 21, 6d supply, fill #0

## 2023-07-16 MED ORDER — TRIAMCINOLONE ACETONIDE 40 MG/ML IJ SUSP
20.0000 mg | Freq: Once | INTRAMUSCULAR | Status: AC
  Administered 2023-07-16: 20 mg

## 2023-07-16 NOTE — Progress Notes (Signed)
 Subjective:  Patient ID: Shannon George, female    DOB: 1980-11-05,  MRN: [id number] HPI Chief Complaint  Patient presents with   Foot Pain    Medial/lateral foot right - husband squeezed foot a few weeks ago and felt a pop, having radiating pain up medial side to leg, numbness on top as well, hurts to walk   New Patient (Initial Visit)    43 y.o. female presents with the above complaint.   ROS: Denies fever chills nausea vomit muscle aches pains calf pain back pain chest pain shortness of breath.  Past Medical History:  Diagnosis Date   BRCA1 gene mutation positive 2014   BRCA 1 valine substitution error. [facility]   Family history of breast cancer    GERD (gastroesophageal reflux disease) 2022   History of Papanicolaou smear of cervix 04/24/13; 06/04/14   -/+; neg   Migraine    Pap smear abnormality of cervix with ASCUS favoring dysplasia    Vitamin D deficiency    Past Surgical History:  Procedure Laterality Date   BREAST BIOPSY Left 06/20/2017   FIBROCYSTIC CHANGE of MRI bx, dumbell marker   CERVICAL BIOPSY  W/ LOOP ELECTRODE EXCISION  2001   [facility]   CESAREAN SECTION  2006, 2008   LAPAROSCOPIC BILATERAL SALPINGECTOMY Bilateral 04/24/2019   Procedure: LAPAROSCOPIC BILATERAL SALPINGO- OOPHORECTOMY;  Surgeon: Vena Austria, MD;  Location: [facility] OR[facility];  Service: Gynecology;  Laterality: Bilateral;   NOVASURE ABLATION  2013   [facility]   TUBAL LIGATION  2008    Current Outpatient Medications:    meloxicam (MOBIC) 15 MG tablet, Take 1 tablet (15 mg total) by mouth daily., Disp: 30 tablet, Rfl: 3   methylPREDNISolone (MEDROL DOSEPAK) 4 MG TBPK tablet, Take 6 tablets (24 mg total) by mouth daily for 1 day, THEN 5 tablets (20 mg total) daily for 1 day, THEN 4 tablets (16 mg total) daily for 1 day, THEN 3 tablets (12 mg total) daily for 1 day, THEN 2 tablets (8 mg total) daily for 1 day, THEN 1 tablet (4 mg total) daily for 1 day., Disp: 21 tablet, Rfl: 0   estradiol (ESTRACE) 1 MG  tablet, Take 1 tablet (1 mg total) by mouth daily., Disp: 90 tablet, Rfl: 3   famotidine (PEPCID) 20 MG tablet, Take 1 tablet (20 mg total) by mouth 2 (two) times daily., Disp: 90 tablet, Rfl: 3   Halobetasol Prop-Tazarotene (DUOBRII) 0.01-0.045 % LOTN, Apply to aa's psoriasis on the elbows QD. Once plaques have thinned switch back to Triamcinolone QD PRN flares., Disp: 100 g, Rfl: 6   ibuprofen (ADVIL) 800 MG tablet, Take 1 tablet (800 mg total) by mouth every 8 (eight) hours as needed., Disp: 21 tablet, Rfl: 0   LORazepam (ATIVAN) 0.5 MG tablet, Take 1 tablet (0.5 mg total) by mouth every 8 (eight) hours as needed for anxiety., Disp: 30 tablet, Rfl: 0   progesterone (PROMETRIUM) 200 MG capsule, Take 1 capsule (200 mg total) by mouth Nightly 6 nights on, 1 night off., Disp: 90 capsule, Rfl: 3   SUMAtriptan (IMITREX) 100 MG tablet, Take 1 tablet (100 mg total) by mouth every 2 (two) hours as needed for migraine. May repeat in 2 hours if headache persists or recurs., Disp: 5 tablet, Rfl: 5   traZODone (DESYREL) 150 MG tablet, Take 1 tablet (150 mg total) by mouth at bedtime., Disp: 90 tablet, Rfl: 2  Allergies  Allergen Reactions   Amoxicillin Rash    Did it involve swelling of  the face/tongue/throat, SOB, or low BP? No Did it involve sudden or severe rash/hives, skin peeling, or any reaction on the inside of your mouth or nose? No Did you need to seek medical attention at a hospital or doctor's office? No When did it last happen?      10+ years ago   Penicillins Rash and Other (See Comments)    Did it involve swelling of the face/tongue/throat, SOB, or low BP? No  Did it involve sudden or severe rash/hives, skin peeling, or any reaction on the inside of your mouth or nose? No  Did you need to seek medical attention at a hospital or doctor's office? No  When did it last happen?      10+ years ago  If all above answers are "NO", may proceed with cephalosporin use.  Did it involve swelling of  the face/tongue/throat, SOB, or low BP? No Did it involve sudden or severe rash/hives, skin peeling, or any reaction on the inside of your mouth or nose? No Did you need to seek medical attention at a hospital or doctor's office? No When did it last happen?      10+ years ago    Did it involve swelling of the face/tongue/throat, SOB, or low BP? No Did it involve sudden or severe rash/hives, skin peeling, or any reaction on the inside of your mouth or nose? No Did you need to seek medical attention at a hospital or doctor's office? No When did it last happen?      10+ years ago If all above answers are "NO", may proceed with cephalosporin use.   Review of Systems Objective:  There were no vitals filed for this visit.  General: Well developed, nourished, in no acute distress, alert and oriented x3   Dermatological: Skin is warm, dry and supple bilateral. Nails x 10 are well maintained; remaining integument appears unremarkable at this time. There are no open sores, no preulcerative lesions, no rash or signs of infection present.  Vascular: Dorsalis Pedis artery and Posterior Tibial artery pedal pulses are 2/4 bilateral with immedate capillary fill time. Pedal hair growth present. No varicosities and no lower extremity edema present bilateral.   Neruologic: Grossly intact via light touch bilateral. Vibratory intact via tuning fork bilateral. Protective threshold with Semmes Wienstein monofilament intact to all pedal sites bilateral. Patellar and Achilles deep tendon reflexes 2+ bilateral. No Babinski or clonus noted bilateral.   Musculoskeletal: No gross boney pedal deformities bilateral. No pain, crepitus, or limitation noted with foot and ankle range of motion bilateral. Muscular strength 5/5 in all groups tested bilateral.  Gait: Unassisted, Nonantalgic.    Radiographs: Radiographs taken today demonstrate osseously's are elevated with good bone mineralization.  She does have soft tissue  increase in density plantar fascial calcaneal insertion site as well as the posterior tibial tendon tract.  No osseous abnormalities other than mild flexible hammertoe deformities.  Assessment & Plan:   Assessment: Planter fasciitis resulting in posterior tibial tendinitis and lateral compensatory syndrome including subtalar joint capsulitis.  Plan: Discussed etiology surgical therapies at this point discussed appropriate shoe gear stretching exercise ice therapy sugar modifications.  I did recommend an injection to the right heel today at the plantar fascial calcaneal insertion site.  I injected 20 mg Kenalog 5 mg Marcaine.  Started her on methylprednisolone to be followed by meloxicam.  I would like to follow-up with her in 1 month.     Zaylei Mullane T. [place], [place]

## 2023-07-16 NOTE — Patient Instructions (Signed)

## 2023-07-23 ENCOUNTER — Ambulatory Visit (INDEPENDENT_AMBULATORY_CARE_PROVIDER_SITE_OTHER): Payer: 59

## 2023-07-23 VITALS — BP 116/67 | HR 83 | Wt 170.0 lb

## 2023-07-23 DIAGNOSIS — Z23 Encounter for immunization: Secondary | ICD-10-CM | POA: Diagnosis not present

## 2023-07-23 NOTE — Progress Notes (Addendum)
    NURSE VISIT NOTE  Subjective:    Patient ID: Shannon George, female    DOB: [birth year], 44 y.o.   MRN: [id number]  HPI  Patient is a 43 y.o. G46P2002 female Married Caucasian female who presents for her second Gardasil injection. Order to administer given by Julieanne Manson, MD on 05/23/2023.   Objective:    BP 116/67 (BP Location: Left Arm, Patient Position: Sitting, Cuff Size: Large)   Pulse 83   Wt 170 lb (77.1 kg)   BMI 29.18 kg/m   43 y.o. LMP: N/A  Contraception:  Sterilization for Men and Women Given by: Doristine Devoid, CMA Site:  Right deltoid  Lab Review  No results found for any visits on 07/23/23.    Assessment:   1. Need for HPV vaccination      Plan:   Patient will return in 4 months for third injection.    Burtis Junes, CMA

## 2023-08-15 ENCOUNTER — Ambulatory Visit (INDEPENDENT_AMBULATORY_CARE_PROVIDER_SITE_OTHER): Admitting: Podiatry

## 2023-08-15 ENCOUNTER — Encounter: Payer: Self-pay | Admitting: Podiatry

## 2023-08-15 DIAGNOSIS — M7671 Peroneal tendinitis, right leg: Secondary | ICD-10-CM | POA: Diagnosis not present

## 2023-08-15 MED ORDER — TRIAMCINOLONE ACETONIDE 40 MG/ML IJ SUSP
20.0000 mg | Freq: Once | INTRAMUSCULAR | Status: AC
  Administered 2023-08-15: 20 mg

## 2023-08-15 NOTE — Progress Notes (Signed)
 She presents today for follow-up of her plantar fasciitis of her right foot she states that she was doing great until she tripped over a rock while in [place] last weekend and really aggravated it.  Objective: Vital signs are stable alert and oriented x 3.  She has some tenderness on palpation of the peroneal tendons as well as the posterior tibial tendon.  The majority of her tenderness is located around the peroneals.  Assessment: Peroneal tendinitis as well as plantar fasciitis.  Plan: Reinjected her today 20 mg Kenalog 5 mg Marcaine only using half of that injection in that area.

## 2023-09-10 ENCOUNTER — Ambulatory Visit: Admitting: Podiatry

## 2023-09-13 ENCOUNTER — Ambulatory Visit (INDEPENDENT_AMBULATORY_CARE_PROVIDER_SITE_OTHER): Admitting: Podiatry

## 2023-09-13 DIAGNOSIS — M722 Plantar fascial fibromatosis: Secondary | ICD-10-CM

## 2023-09-13 DIAGNOSIS — Q666 Other congenital valgus deformities of feet: Secondary | ICD-10-CM | POA: Diagnosis not present

## 2023-09-13 NOTE — Progress Notes (Signed)
 Subjective:  Patient ID: Shannon George, female    DOB: 12/23/1980,  MRN: [id number]  Chief Complaint  Patient presents with   Foot Pain    Pt stated that she saw Dr Lara Plants about a month ago and he gave her a shot but she is still having a lot of pain and discomfot    43 y.o. female presents with the above complaint.  Patient presents with right plantar fasciitis/heel pain that has been going for quite some time is progressive gotten worse worse with ambulation is with pressure patient would like to discuss treatment options for this.  She has been known to Dr. Lara Plants who has treated her for peroneal tendinitis in the past.  Her pain is now localized to the bottom of the heel.   Review of Systems: Negative except as noted in the HPI. Denies N/V/F/Ch.  Past Medical History:  Diagnosis Date   BRCA1 gene mutation positive 2014   BRCA 1 valine substitution error. [facility]   Family history of breast cancer    GERD (gastroesophageal reflux disease) 2022   History of Papanicolaou smear of cervix 04/24/13; 06/04/14   -/+; neg   Migraine    Pap smear abnormality of cervix with ASCUS favoring dysplasia    Vitamin D  deficiency     Current Outpatient Medications:    estradiol  (ESTRACE ) 1 MG tablet, Take 1 tablet (1 mg total) by mouth daily., Disp: 90 tablet, Rfl: 3   famotidine  (PEPCID ) 20 MG tablet, Take 1 tablet (20 mg total) by mouth 2 (two) times daily., Disp: 90 tablet, Rfl: 3   Halobetasol Prop-Tazarotene (DUOBRII ) 0.01-0.045 % LOTN, Apply to aa's psoriasis on the elbows QD. Once plaques have thinned switch back to Triamcinolone  QD PRN flares., Disp: 100 g, Rfl: 6   ibuprofen  (ADVIL ) 800 MG tablet, Take 1 tablet (800 mg total) by mouth every 8 (eight) hours as needed., Disp: 21 tablet, Rfl: 0   LORazepam  (ATIVAN ) 0.5 MG tablet, Take 1 tablet (0.5 mg total) by mouth every 8 (eight) hours as needed for anxiety., Disp: 30 tablet, Rfl: 0   meloxicam  (MOBIC ) 15 MG tablet, Take 1 tablet (15  mg total) by mouth daily., Disp: 30 tablet, Rfl: 3   progesterone  (PROMETRIUM ) 200 MG capsule, Take 1 capsule (200 mg total) by mouth Nightly 6 nights on, 1 night off., Disp: 90 capsule, Rfl: 3   SUMAtriptan  (IMITREX ) 100 MG tablet, Take 1 tablet (100 mg total) by mouth every 2 (two) hours as needed for migraine. May repeat in 2 hours if headache persists or recurs., Disp: 5 tablet, Rfl: 5   traZODone  (DESYREL ) 150 MG tablet, Take 1 tablet (150 mg total) by mouth at bedtime., Disp: 90 tablet, Rfl: 2  Social History   Tobacco Use  Smoking Status Never  Smokeless Tobacco Never    Allergies  Allergen Reactions   Amoxicillin Rash    Did it involve swelling of the face/tongue/throat, SOB, or low BP? No Did it involve sudden or severe rash/hives, skin peeling, or any reaction on the inside of your mouth or nose? No Did you need to seek medical attention at a hospital or doctor's office? No When did it last happen?      10+ years ago   Penicillins Rash and Other (See Comments)    Did it involve swelling of the face/tongue/throat, SOB, or low BP? No  Did it involve sudden or severe rash/hives, skin peeling, or any reaction on the inside of your mouth or  nose? No  Did you need to seek medical attention at a hospital or doctor's office? No  When did it last happen?      10+ years ago  If all above answers are "NO", may proceed with cephalosporin use.  Did it involve swelling of the face/tongue/throat, SOB, or low BP? No Did it involve sudden or severe rash/hives, skin peeling, or any reaction on the inside of your mouth or nose? No Did you need to seek medical attention at a hospital or doctor's office? No When did it last happen?      10+ years ago    Did it involve swelling of the face/tongue/throat, SOB, or low BP? No Did it involve sudden or severe rash/hives, skin peeling, or any reaction on the inside of your mouth or nose? No Did you need to seek medical attention at a hospital or  doctor's office? No When did it last happen?      10+ years ago If all above answers are "NO", may proceed with cephalosporin use.   Objective:  There were no vitals filed for this visit. There is no height or weight on file to calculate BMI. Constitutional Well developed. Well nourished.  Vascular Dorsalis pedis pulses palpable bilaterally. Posterior tibial pulses palpable bilaterally. Capillary refill normal to all digits.  No cyanosis or clubbing noted. Pedal hair growth normal.  Neurologic Normal speech. Oriented to person, place, and time. Epicritic sensation to light touch grossly present bilaterally.  Dermatologic Nails well groomed and normal in appearance. No open wounds. No skin lesions.  Orthopedic: Normal joint ROM without pain or crepitus bilaterally. No visible deformities. Tender to palpation at the calcaneal tuber right. No pain with calcaneal squeeze right. Ankle ROM diminished range of motion right. Silfverskiold Test: positive right.   Radiographs: None Assessment:   1. Plantar fasciitis, right   2. Pes planovalgus    Plan:  Patient was evaluated and treated and all questions answered.  Plantar Fasciitis, right with underlying pes planovalgus - XR reviewed as above.  - Educated on icing and stretching. Instructions given.  - Injection delivered to the plantar fascia as below. - DME: Plantar fascial brace dispensed to support the medial longitudinal arch of the foot and offload pressure from the heel and prevent arch collapse during weightbearing - Pharmacologic management: None  Pes planovalgus -I explained to patient the etiology of pes planovalgus and relationship with Planter fasciitis and various treatment options were discussed.  Given patient foot structure in the setting of Planter fasciitis I believe patient will benefit from custom-made orthotics to help control the hindfoot motion support the arch of the foot and take the stress away from plantar  fascial.  Patient agrees with the plan like to proceed with orthotics -Patient was casted for orthotics   Procedure: Injection Tendon/Ligament Location: Right plantar fascia at the glabrous junction; medial approach. Skin Prep: alcohol Injectate: 0.5 cc 0.5% marcaine  plain, 0.5 cc of 1% Lidocaine , 0.5 cc kenalog  10. Disposition: Patient tolerated procedure well. Injection site dressed with a band-aid.  No follow-ups on file.

## 2023-09-22 IMAGING — MG MM DIGITAL SCREENING BILAT W/ TOMO AND CAD
8 series · 8 of 24 positions shown · non-contrast
Comparison: Previous exam(s).

CLINICAL DATA: Screening.

EXAM:
DIGITAL SCREENING BILATERAL MAMMOGRAM WITH TOMOSYNTHESIS AND CAD
TECHNIQUE: Bilateral screening digital craniocaudal and mediolateral oblique
mammograms were obtained. Bilateral screening digital breast
tomosynthesis was performed. The images were evaluated with
computer-aided detection.

[R CC synth-2D]
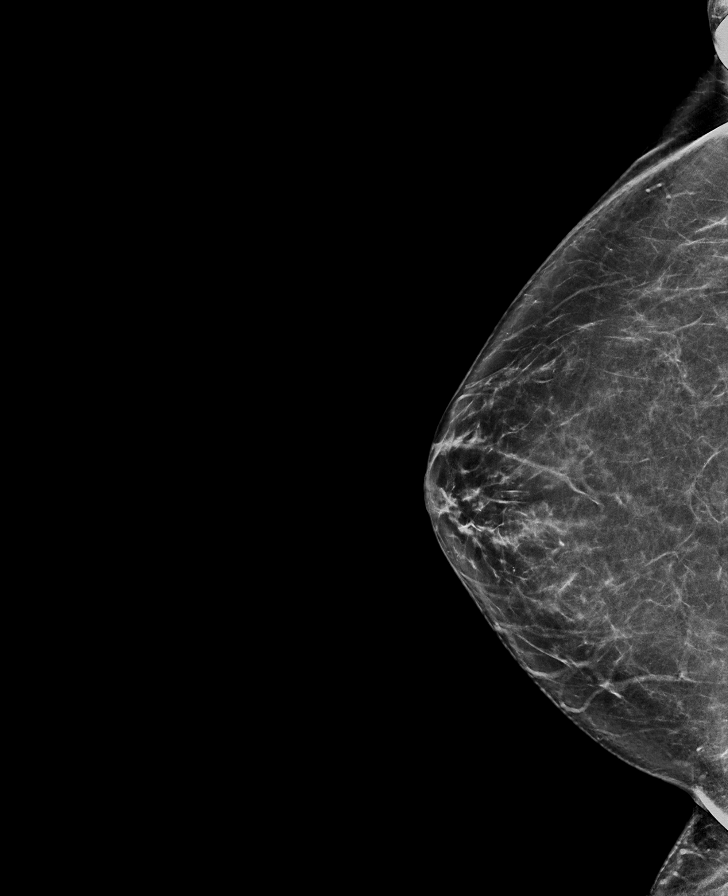

[L CC synth-2D]
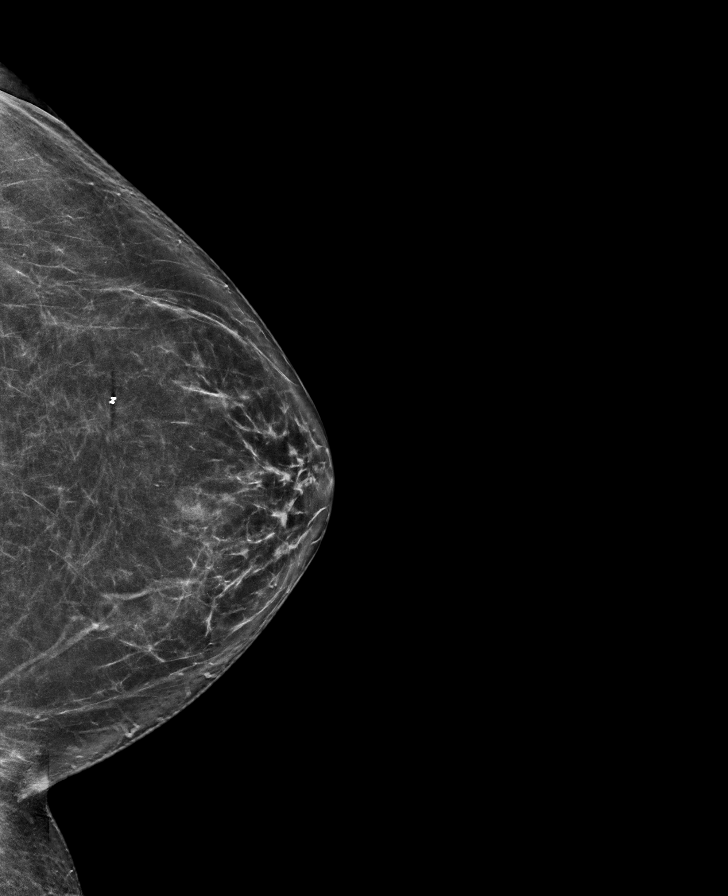

[R MLO synth-2D]
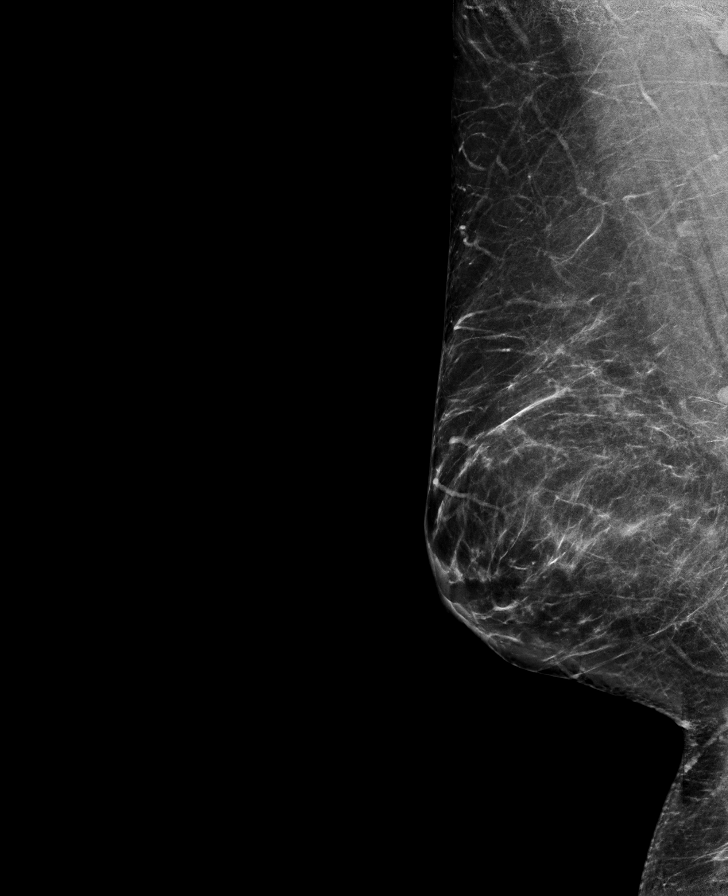

[L MLO synth-2D]
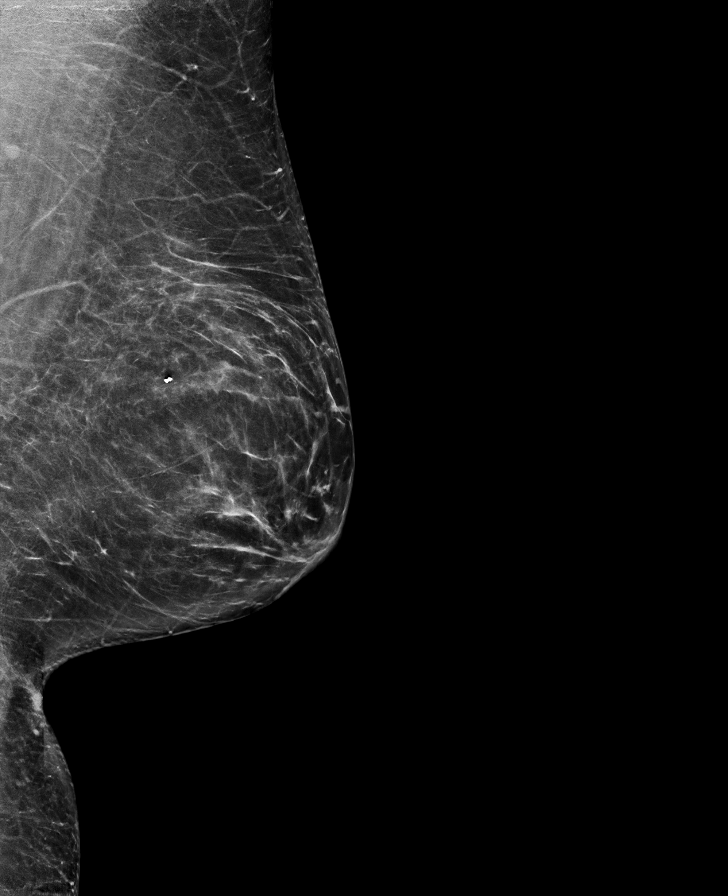

[R MLO tomo · tomo slice 41/80.0]
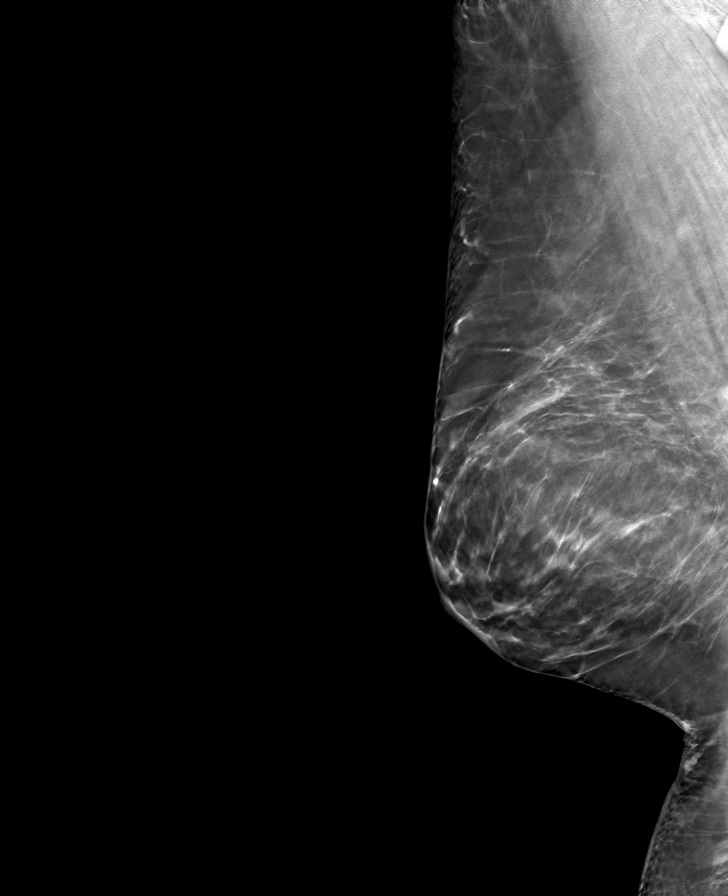

[L MLO tomo · tomo slice 39/78.0]
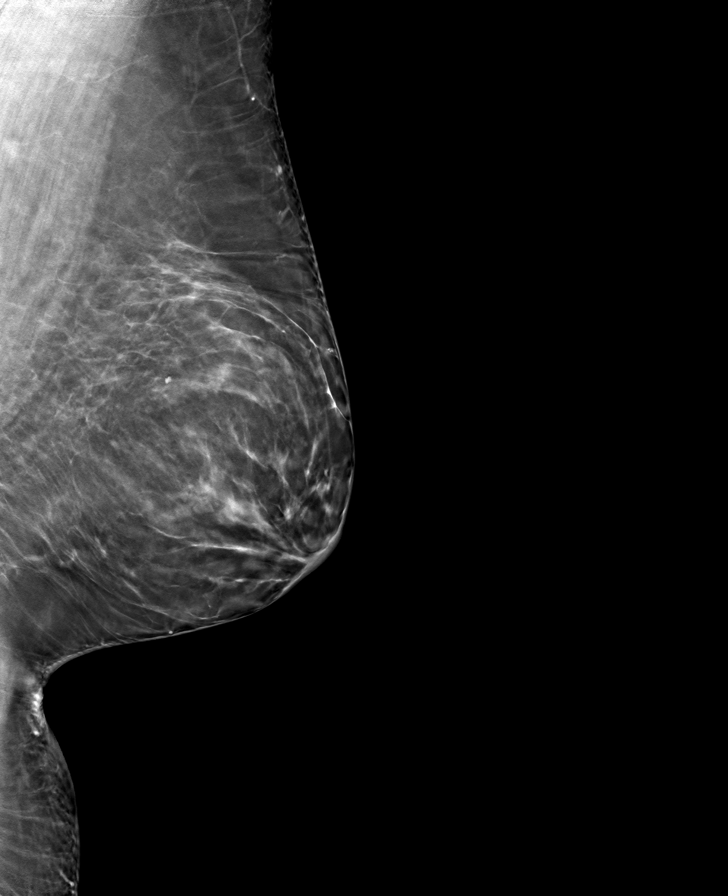

[R CC tomo · tomo slice 39/77.0]
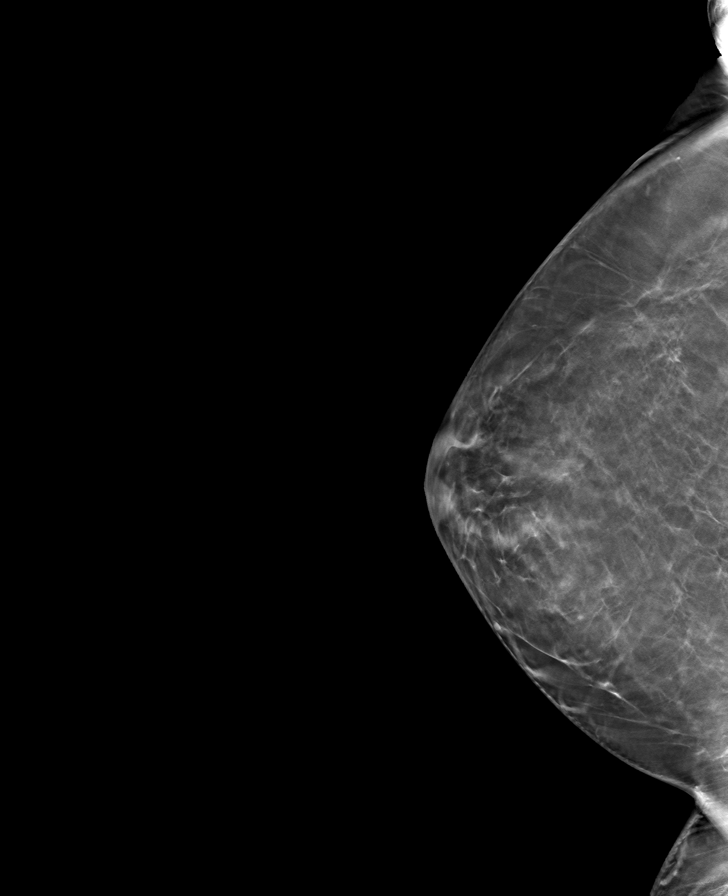

[L CC tomo · tomo slice 39/76.0]
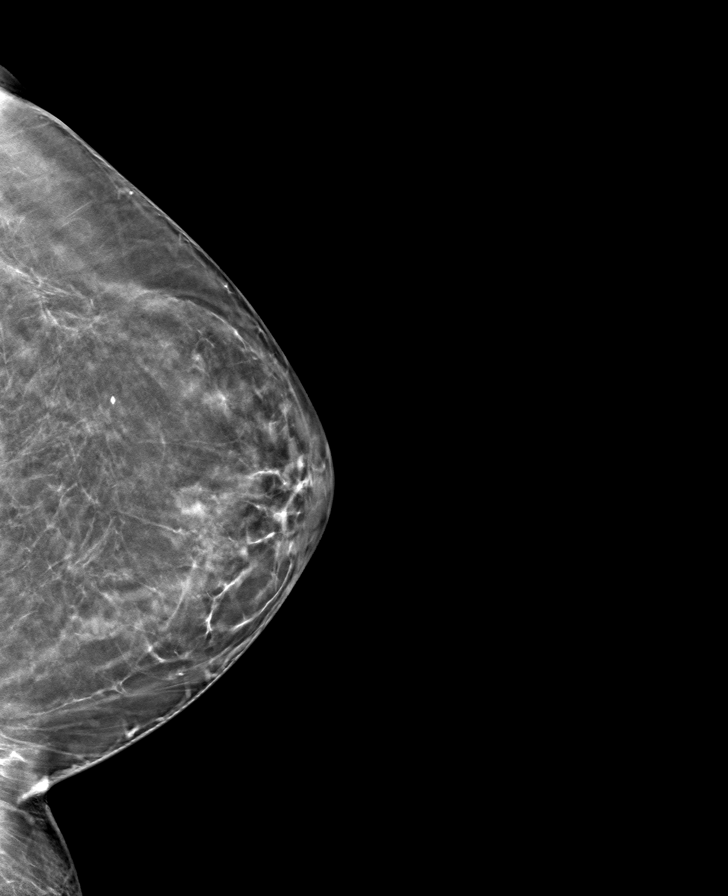

[8 of 24 positions shown; findings below may reference images not displayed]

ACR Breast Density Category b: There are scattered areas of
fibroglandular density.
FINDINGS: There are no findings suspicious for malignancy.
IMPRESSION: No mammographic evidence of malignancy. A result letter of this
screening mammogram will be mailed directly to the patient.

RECOMMENDATION:
Screening mammogram in one year. (Code:51-O-LD2)

BI-RADS CATEGORY  1: Negative.

## 2023-10-11 ENCOUNTER — Ambulatory Visit (INDEPENDENT_AMBULATORY_CARE_PROVIDER_SITE_OTHER): Admitting: Podiatry

## 2023-10-11 DIAGNOSIS — Q666 Other congenital valgus deformities of feet: Secondary | ICD-10-CM

## 2023-10-11 DIAGNOSIS — M722 Plantar fascial fibromatosis: Secondary | ICD-10-CM

## 2023-10-11 NOTE — Progress Notes (Signed)
 Subjective:  Patient ID: Shannon George, female    DOB: 01-30-1981,  MRN: [id number]  Chief Complaint  Patient presents with   Plantar Fasciitis    Pt stated that she has good days and bad ones     43 y.o. female presents with the above complaint.  Patient presents with right plantar fasciitis/heel pain that has been going for quite some time is progressive gotten worse worse with ambulation is with pressure patient would like to discuss treatment options for this.  She has been known to Dr. Lara Plants who has treated her for peroneal tendinitis in the past.  Her pain is now localized to the bottom of the heel.   Review of Systems: Negative except as noted in the HPI. Denies N/V/F/Ch.  Past Medical History:  Diagnosis Date   BRCA1 gene mutation positive 2014   BRCA 1 valine substitution error. [facility]   Family history of breast cancer    GERD (gastroesophageal reflux disease) 2022   History of Papanicolaou smear of cervix 04/24/13; 06/04/14   -/+; neg   Migraine    Pap smear abnormality of cervix with ASCUS favoring dysplasia    Vitamin D  deficiency     Current Outpatient Medications:    triamcinolone  cream (KENALOG ) 0.1 %, Apply topically., Disp: , Rfl:    estradiol  (ESTRACE ) 1 MG tablet, Take 1 tablet (1 mg total) by mouth daily., Disp: 90 tablet, Rfl: 3   famotidine  (PEPCID ) 20 MG tablet, Take 1 tablet (20 mg total) by mouth 2 (two) times daily., Disp: 90 tablet, Rfl: 3   Halobetasol Prop-Tazarotene (DUOBRII ) 0.01-0.045 % LOTN, Apply to aa's psoriasis on the elbows QD. Once plaques have thinned switch back to Triamcinolone  QD PRN flares., Disp: 100 g, Rfl: 6   ibuprofen  (ADVIL ) 800 MG tablet, Take 1 tablet (800 mg total) by mouth every 8 (eight) hours as needed., Disp: 21 tablet, Rfl: 0   LORazepam  (ATIVAN ) 0.5 MG tablet, Take 1 tablet (0.5 mg total) by mouth every 8 (eight) hours as needed for anxiety., Disp: 30 tablet, Rfl: 0   meloxicam  (MOBIC ) 15 MG tablet, Take 1 tablet  (15 mg total) by mouth daily., Disp: 30 tablet, Rfl: 3   progesterone  (PROMETRIUM ) 200 MG capsule, Take 1 capsule (200 mg total) by mouth Nightly 6 nights on, 1 night off., Disp: 90 capsule, Rfl: 3   SUMAtriptan  (IMITREX ) 100 MG tablet, Take 1 tablet (100 mg total) by mouth every 2 (two) hours as needed for migraine. May repeat in 2 hours if headache persists or recurs., Disp: 5 tablet, Rfl: 5   traZODone  (DESYREL ) 150 MG tablet, Take 1 tablet (150 mg total) by mouth at bedtime., Disp: 90 tablet, Rfl: 2  Social History   Tobacco Use  Smoking Status Never  Smokeless Tobacco Never    Allergies  Allergen Reactions   Amoxicillin Rash    Did it involve swelling of the face/tongue/throat, SOB, or low BP? No Did it involve sudden or severe rash/hives, skin peeling, or any reaction on the inside of your mouth or nose? No Did you need to seek medical attention at a hospital or doctor's office? No When did it last happen?      10+ years ago   Penicillins Rash and Other (See Comments)    Did it involve swelling of the face/tongue/throat, SOB, or low BP? No  Did it involve sudden or severe rash/hives, skin peeling, or any reaction on the inside of your mouth or nose? No  Did  you need to seek medical attention at a hospital or doctor's office? No  When did it last happen?      10+ years ago  If all above answers are "NO", may proceed with cephalosporin use.  Did it involve swelling of the face/tongue/throat, SOB, or low BP? No Did it involve sudden or severe rash/hives, skin peeling, or any reaction on the inside of your mouth or nose? No Did you need to seek medical attention at a hospital or doctor's office? No When did it last happen?      10+ years ago    Did it involve swelling of the face/tongue/throat, SOB, or low BP? No Did it involve sudden or severe rash/hives, skin peeling, or any reaction on the inside of your mouth or nose? No Did you need to seek medical attention at a hospital or  doctor's office? No When did it last happen?      10+ years ago If all above answers are "NO", may proceed with cephalosporin use.   Objective:  There were no vitals filed for this visit. There is no height or weight on file to calculate BMI. Constitutional Well developed. Well nourished.  Vascular Dorsalis pedis pulses palpable bilaterally. Posterior tibial pulses palpable bilaterally. Capillary refill normal to all digits.  No cyanosis or clubbing noted. Pedal hair growth normal.  Neurologic Normal speech. Oriented to person, place, and time. Epicritic sensation to light touch grossly present bilaterally.  Dermatologic Nails well groomed and normal in appearance. No open wounds. No skin lesions.  Orthopedic: Normal joint ROM without pain or crepitus bilaterally. No visible deformities. Tender to palpation at the calcaneal tuber right. No pain with calcaneal squeeze right. Ankle ROM diminished range of motion right. Silfverskiold Test: positive right.   Radiographs: None Assessment:   No diagnosis found.  Plan:  Patient was evaluated and treated and all questions answered.  Plantar Fasciitis, right with underlying pes planovalgus - XR reviewed as above.  - Educated on icing and stretching. Instructions given.  - Injection delivered to the plantar fascia as below. - DME: Cam boot immobilization and steroid injection - Pharmacologic management: None  Pes planovalgus -I explained to patient the etiology of pes planovalgus and relationship with Planter fasciitis and various treatment options were discussed.  Given patient foot structure in the setting of Planter fasciitis I believe patient will benefit from custom-made orthotics to help control the hindfoot motion support the arch of the foot and take the stress away from plantar fascial.  Patient agrees with the plan like to proceed with orthotics -Patient was casted for orthotics   Procedure: Injection  Tendon/Ligament Location: Right plantar fascia at the glabrous junction; medial approach. Skin Prep: alcohol Injectate: 0.5 cc 0.5% marcaine  plain, 0.5 cc of 1% Lidocaine , 0.5 cc kenalog  10. Disposition: Patient tolerated procedure well. Injection site dressed with a band-aid.  No follow-ups on file.

## 2023-11-13 ENCOUNTER — Ambulatory Visit (INDEPENDENT_AMBULATORY_CARE_PROVIDER_SITE_OTHER): Admitting: Podiatry

## 2023-11-13 ENCOUNTER — Encounter: Payer: Self-pay | Admitting: Podiatry

## 2023-11-13 VITALS — Ht 64.0 in | Wt 170.0 lb

## 2023-11-13 DIAGNOSIS — M722 Plantar fascial fibromatosis: Secondary | ICD-10-CM | POA: Diagnosis not present

## 2023-11-13 NOTE — Progress Notes (Signed)
 Subjective:  Patient ID: Shannon George, female    DOB: Jun 09, 1980,  MRN: [id number]  Chief Complaint  Patient presents with   Plantar Fasciitis    Pt is here to f/u on right foot due to plantar fasciitis she states still some pain to the foot and is wearing boot.    43 y.o. female presents with the above complaint.  Patient presents for follow-up of right plantar fasciitis.  She states that she is doing well.  Denies any other acute complaints would like to discuss next treatment plan  Review of Systems: Negative except as noted in the HPI. Denies N/V/F/Ch.  Past Medical History:  Diagnosis Date   BRCA1 gene mutation positive 2014   BRCA 1 valine substitution error. [facility]   Family history of breast cancer    GERD (gastroesophageal reflux disease) 2022   History of Papanicolaou smear of cervix 04/24/13; 06/04/14   -/+; neg   Migraine    Pap smear abnormality of cervix with ASCUS favoring dysplasia    Vitamin D  deficiency     Current Outpatient Medications:    estradiol  (ESTRACE ) 1 MG tablet, Take 1 tablet (1 mg total) by mouth daily., Disp: 90 tablet, Rfl: 3   famotidine  (PEPCID ) 20 MG tablet, Take 1 tablet (20 mg total) by mouth 2 (two) times daily., Disp: 90 tablet, Rfl: 3   Halobetasol Prop-Tazarotene (DUOBRII ) 0.01-0.045 % LOTN, Apply to aa's psoriasis on the elbows QD. Once plaques have thinned switch back to Triamcinolone  QD PRN flares., Disp: 100 g, Rfl: 6   ibuprofen  (ADVIL ) 800 MG tablet, Take 1 tablet (800 mg total) by mouth every 8 (eight) hours as needed., Disp: 21 tablet, Rfl: 0   LORazepam  (ATIVAN ) 0.5 MG tablet, Take 1 tablet (0.5 mg total) by mouth every 8 (eight) hours as needed for anxiety., Disp: 30 tablet, Rfl: 0   meloxicam  (MOBIC ) 15 MG tablet, Take 1 tablet (15 mg total) by mouth daily., Disp: 30 tablet, Rfl: 3   progesterone  (PROMETRIUM ) 200 MG capsule, Take 1 capsule (200 mg total) by mouth Nightly 6 nights on, 1 night off., Disp: 90 capsule, Rfl:  3   SUMAtriptan  (IMITREX ) 100 MG tablet, Take 1 tablet (100 mg total) by mouth every 2 (two) hours as needed for migraine. May repeat in 2 hours if headache persists or recurs., Disp: 5 tablet, Rfl: 5   traZODone  (DESYREL ) 150 MG tablet, Take 1 tablet (150 mg total) by mouth at bedtime., Disp: 90 tablet, Rfl: 2   triamcinolone  cream (KENALOG ) 0.1 %, Apply topically., Disp: , Rfl:   Social History   Tobacco Use  Smoking Status Never  Smokeless Tobacco Never    Allergies  Allergen Reactions   Amoxicillin Rash    Did it involve swelling of the face/tongue/throat, SOB, or low BP? No Did it involve sudden or severe rash/hives, skin peeling, or any reaction on the inside of your mouth or nose? No Did you need to seek medical attention at a hospital or doctor's office? No When did it last happen?      10+ years ago   Penicillins Rash and Other (See Comments)    Did it involve swelling of the face/tongue/throat, SOB, or low BP? No  Did it involve sudden or severe rash/hives, skin peeling, or any reaction on the inside of your mouth or nose? No  Did you need to seek medical attention at a hospital or doctor's office? No  When did it last happen?  10+ years ago  If all above answers are "NO", may proceed with cephalosporin use.  Did it involve swelling of the face/tongue/throat, SOB, or low BP? No Did it involve sudden or severe rash/hives, skin peeling, or any reaction on the inside of your mouth or nose? No Did you need to seek medical attention at a hospital or doctor's office? No When did it last happen?      10+ years ago    Did it involve swelling of the face/tongue/throat, SOB, or low BP? No Did it involve sudden or severe rash/hives, skin peeling, or any reaction on the inside of your mouth or nose? No Did you need to seek medical attention at a hospital or doctor's office? No When did it last happen?      10+ years ago If all above answers are "NO", may proceed with cephalosporin  use.   Objective:  There were no vitals filed for this visit. Body mass index is 29.18 kg/m. Constitutional Well developed. Well nourished.  Vascular Dorsalis pedis pulses palpable bilaterally. Posterior tibial pulses palpable bilaterally. Capillary refill normal to all digits.  No cyanosis or clubbing noted. Pedal hair growth normal.  Neurologic Normal speech. Oriented to person, place, and time. Epicritic sensation to light touch grossly present bilaterally.  Dermatologic Nails well groomed and normal in appearance. No open wounds. No skin lesions.  Orthopedic: Normal joint ROM without pain or crepitus bilaterally. No visible deformities. Tender to palpation at the calcaneal tuber right. No pain with calcaneal squeeze right. Ankle ROM diminished range of motion right. Silfverskiold Test: positive right.   Radiographs: None Assessment:   1. Plantar fasciitis, right     Plan:  Patient was evaluated and treated and all questions answered.  Plantar Fasciitis, right with underlying pes planovalgus - XR reviewed as above.  - Educated on icing and stretching. Instructions given.  - Will hold off on steroid injection as they have not helped - DME: Continue cam boot immobilization as needed - Pharmacologic management: None - Patient was referred to [facility]ractor for EPAT therapy.  Pes planovalgus -I explained to patient the etiology of pes planovalgus and relationship with Planter fasciitis and various treatment options were discussed.  Given patient foot structure in the setting of Planter fasciitis I believe patient will benefit from custom-made orthotics to help control the hindfoot motion support the arch of the foot and take the stress away from plantar fascial.  Patient agrees with the plan like to proceed with orthotics - Patient is awaiting orthotics  No follow-ups on file.

## 2023-11-22 ENCOUNTER — Ambulatory Visit

## 2023-11-22 VITALS — BP 108/61 | HR 75 | Ht 64.0 in | Wt 198.0 lb

## 2023-11-22 DIAGNOSIS — Z23 Encounter for immunization: Secondary | ICD-10-CM

## 2023-11-22 NOTE — Progress Notes (Signed)
    NURSE VISIT NOTE  Subjective:    Patient ID: Shannon George, female    DOB: 02/02/1981, 43 y.o.   MRN: [id number]  HPI  Patient is a 43 y.o. G59P2002 female who presents for her third Gardasil injection.   Objective:    BP 108/61   Pulse 75   Ht 5' 4 (1.626 m)   Wt 198 lb (89.8 kg)   BMI 33.99 kg/m   43 y.o. LMP:  N/A  Contraception:  Tubal Ligation Given by: Beola Skeens, CMA Site:  left deltoid   Assessment:   1. Need for HPV vaccination      Plan:   Patient has completed Gardisil vaccines.   Beola Skeens, CMA

## 2023-11-22 NOTE — Patient Instructions (Signed)
Human Papillomavirus (HPV) Vaccine Injection What is this medication? HUMAN PAPILLOMAVIRUS VACCINE (HYOO muhn pap uh LOH muh vahy ruhs vak SEEN) reduces the risk of human papillomavirus (HPV). It does not treat HPV. It is still possible to get HPV after receiving this vaccine, but the symptoms may be less severe or not last as long. It works by helping your immune system learn how to fight off a future infection. This medicine may be used for other purposes; ask your health care provider or pharmacist if you have questions. COMMON BRAND NAME(S): Gardasil 9 What should I tell my care team before I take this medication? They need to know if you have any of these conditions: Fever Hemophilia HIV or AIDS Immune system problems Infection Low platelets An unusual reaction to human papillomavirus vaccine, yeast, other vaccines, other medications, foods, dyes, or preservatives Pregnant or trying to get pregnant Breastfeeding How should I use this medication? This vaccine is injected into a muscle. It is given by your care team. This vaccine requires 2 or 3 doses to get the full benefit. Set a reminder for when your next dose is due. A copy of the Vaccine Information Statement will be given before each vaccination. Be sure to read this information carefully each time. This sheet may change often. Talk to your care team about the use of this medication in children. While it may be prescribed for children as young as 9 years for selected conditions, precautions do apply. Overdosage: If you think you have taken too much of this medicine contact a poison control center or emergency room at once. NOTE: This medicine is only for you. Do not share this medicine with others. What if I miss a dose? Keep appointments for follow-up doses as directed. It is important not to miss your dose. Call your care team if you are unable to keep an appointment. What may interact with this medication? Certain medications  for arthritis Medications for organ transplant Medications to treat cancer Steroid medications, such as prednisone or cortisone This list may not describe all possible interactions. Give your health care provider a list of all the medicines, herbs, non-prescription drugs, or dietary supplements you use. Also tell them if you smoke, drink alcohol, or use illegal drugs. Some items may interact with your medicine. What should I watch for while using this medication? Visit your care team regularly. Report any side effects to your care team right away. This vaccine, like all vaccines, may not fully protect everyone. What side effects may I notice from receiving this medication? Side effects that you should report to your care team as soon as possible: Allergic reactions--skin rash, itching, hives, swelling of the face, lips, tongue, or throat Feeling faint or lightheaded Side effects that usually do not require medical attention (report these to your care team if they continue or are bothersome): Diarrhea Dizziness Fatigue Fever Headache Nausea Pain, redness, irritation, or bruising at the injection site This list may not describe all possible side effects. Call your doctor for medical advice about side effects. You may report side effects to FDA at 1-800-FDA-1088. Where should I keep my medication? This vaccine is only given by your care team. It will not be stored at home. NOTE: This sheet is a summary. It may not cover all possible information. If you have questions about this medicine, talk to your doctor, pharmacist, or health care provider.  2024 Elsevier/Gold Standard (2021-10-05 00:00:00)

## 2023-11-28 ENCOUNTER — Encounter: Payer: Self-pay | Admitting: Podiatry

## 2023-12-04 ENCOUNTER — Ambulatory Visit (INDEPENDENT_AMBULATORY_CARE_PROVIDER_SITE_OTHER): Admitting: Podiatry

## 2023-12-04 DIAGNOSIS — M62461 Contracture of muscle, right lower leg: Secondary | ICD-10-CM

## 2023-12-04 DIAGNOSIS — M722 Plantar fascial fibromatosis: Secondary | ICD-10-CM

## 2023-12-04 NOTE — Progress Notes (Signed)
 Subjective:  Patient ID: Shannon George, female    DOB: Sep 07, 1980,  MRN: [id number]  Chief Complaint  Patient presents with   Plantar Fasciitis    Pt is wanting to sign paperwork for surgery     43 y.o. female presents with the above complaint.  Patient presents for follow-up of right plantar fasciitis.  She states that she is doing well.  Denies any other acute complaints would like to discuss next treatment plan  Review of Systems: Negative except as noted in the HPI. Denies N/V/F/Ch.  Past Medical History:  Diagnosis Date   BRCA1 gene mutation positive 2014   BRCA 1 valine substitution error. [facility]   Family history of breast cancer    GERD (gastroesophageal reflux disease) 2022   History of Papanicolaou smear of cervix 04/24/13; 06/04/14   -/+; neg   Migraine    Pap smear abnormality of cervix with ASCUS favoring dysplasia    Vitamin D  deficiency     Current Outpatient Medications:    estradiol  (ESTRACE ) 1 MG tablet, Take 1 tablet (1 mg total) by mouth daily., Disp: 90 tablet, Rfl: 3   famotidine  (PEPCID ) 20 MG tablet, Take 1 tablet (20 mg total) by mouth 2 (two) times daily., Disp: 90 tablet, Rfl: 3   Halobetasol Prop-Tazarotene (DUOBRII ) 0.01-0.045 % LOTN, Apply to aa's psoriasis on the elbows QD. Once plaques have thinned switch back to Triamcinolone  QD PRN flares., Disp: 100 g, Rfl: 6   ibuprofen  (ADVIL ) 800 MG tablet, Take 1 tablet (800 mg total) by mouth every 8 (eight) hours as needed., Disp: 21 tablet, Rfl: 0   LORazepam  (ATIVAN ) 0.5 MG tablet, Take 1 tablet (0.5 mg total) by mouth every 8 (eight) hours as needed for anxiety., Disp: 30 tablet, Rfl: 0   progesterone  (PROMETRIUM ) 200 MG capsule, Take 1 capsule (200 mg total) by mouth Nightly 6 nights on, 1 night off., Disp: 90 capsule, Rfl: 3   SUMAtriptan  (IMITREX ) 100 MG tablet, Take 1 tablet (100 mg total) by mouth every 2 (two) hours as needed for migraine. May repeat in 2 hours if headache persists or recurs.,  Disp: 5 tablet, Rfl: 5   traZODone  (DESYREL ) 150 MG tablet, Take 1 tablet (150 mg total) by mouth at bedtime., Disp: 90 tablet, Rfl: 2  Social History   Tobacco Use  Smoking Status Never  Smokeless Tobacco Never    Allergies  Allergen Reactions   Amoxicillin Rash    Did it involve swelling of the face/tongue/throat, SOB, or low BP? No Did it involve sudden or severe rash/hives, skin peeling, or any reaction on the inside of your mouth or nose? No Did you need to seek medical attention at a hospital or doctor's office? No When did it last happen?      10+ years ago   Penicillins Rash and Other (See Comments)    Did it involve swelling of the face/tongue/throat, SOB, or low BP? No  Did it involve sudden or severe rash/hives, skin peeling, or any reaction on the inside of your mouth or nose? No  Did you need to seek medical attention at a hospital or doctor's office? No  When did it last happen?      10+ years ago  If all above answers are "NO", may proceed with cephalosporin use.  Did it involve swelling of the face/tongue/throat, SOB, or low BP? No Did it involve sudden or severe rash/hives, skin peeling, or any reaction on the inside of your mouth or nose?  No Did you need to seek medical attention at a hospital or doctor's office? No When did it last happen?      10+ years ago    Did it involve swelling of the face/tongue/throat, SOB, or low BP? No Did it involve sudden or severe rash/hives, skin peeling, or any reaction on the inside of your mouth or nose? No Did you need to seek medical attention at a hospital or doctor's office? No When did it last happen?      10+ years ago If all above answers are "NO", may proceed with cephalosporin use.   Objective:  There were no vitals filed for this visit. There is no height or weight on file to calculate BMI. Constitutional Well developed. Well nourished.  Vascular Dorsalis pedis pulses palpable bilaterally. Posterior tibial pulses  palpable bilaterally. Capillary refill normal to all digits.  No cyanosis or clubbing noted. Pedal hair growth normal.  Neurologic Normal speech. Oriented to person, place, and time. Epicritic sensation to light touch grossly present bilaterally.  Dermatologic Nails well groomed and normal in appearance. No open wounds. No skin lesions.  Orthopedic: Normal joint ROM without pain or crepitus bilaterally. No visible deformities. Tender to palpation at the calcaneal tuber right. No pain with calcaneal squeeze right. Ankle ROM diminished range of motion right. Silfverskiold Test: positive right.   Radiographs: None Assessment:   1. Plantar fasciitis, right   2. Gastrocnemius equinus, right      Plan:  Patient was evaluated and treated and all questions answered.  Plantar Fasciitis, right with underlying pes planovalgus - Clinically given that her plantar fasciitis has not resolved itself at this time she has failed all conservative treatment options including shoe gear modification padding protecting offloading at this time given that she has failed all conservative care she would benefit from surgical intervention I discussed with endoscopic plantar fasciotomy with gastrocnemius recession I discussed this with patient in extensive detail she states understanding and would like to proceed with surgery.  I discussed my preoperative or postop plan with the patient in extensive detail Informed surgical risk consent was reviewed and read aloud to the patient.  I reviewed the films.  I have discussed my findings with the patient in great detail.  I have discussed all risks including but not limited to infection, stiffness, scarring, limp, disability, deformity, damage to blood vessels and nerves, numbness, poor healing, need for braces, arthritis, chronic pain, amputation, death.  All benefits and realistic expectations discussed in great detail.  I have made no promises as to the outcome.  I  have provided realistic expectations.  I have offered the patient a 2nd opinion, which they have declined and assured me they preferred to proceed despite the risks   Pes planovalgus -I explained to patient the etiology of pes planovalgus and relationship with Planter fasciitis and various treatment options were discussed.  Given patient foot structure in the setting of Planter fasciitis I believe patient will benefit from custom-made orthotics to help control the hindfoot motion support the arch of the foot and take the stress away from plantar fascial.  Patient agrees with the plan like to proceed with orthotics - Patient is awaiting orthotics  No follow-ups on file.

## 2023-12-19 ENCOUNTER — Telehealth: Payer: Self-pay | Admitting: Podiatry

## 2023-12-19 NOTE — Telephone Encounter (Signed)
 DOS- 12/31/2023  ENDOSCOPIC PLANTAR FASCIOTOMY + POP BLOCK RT- 70106 GASTROCNEMIUS RECESS RT- 72312  [employer] EFFECTIVE DATE-  05/08/2022  DEDUCTIBLE- $4000 REMAINING- $536 OOP- $6500 REMAINING- $5643.60 FAMILY OOP- $13000 REMAINING- $0938.77 COINSURANCE- 30%/ $0 COPAY  PER [employer] WEBSITE, NO PRIOR AUTHS ARE REQUIRED FOR CPT CODES 70106 AND [id number]. DOCUMENTATION ATTACHED TO SURGERY CONSENT PACKET.

## 2023-12-19 NOTE — Telephone Encounter (Deleted)
 DOS- 12/31/2023  ENDOSCOPIC PLANTAR

## 2023-12-27 ENCOUNTER — Ambulatory Visit: Admitting: Podiatry

## 2023-12-31 ENCOUNTER — Other Ambulatory Visit: Payer: Self-pay

## 2023-12-31 ENCOUNTER — Other Ambulatory Visit: Payer: Self-pay | Admitting: Podiatry

## 2023-12-31 DIAGNOSIS — M722 Plantar fascial fibromatosis: Secondary | ICD-10-CM | POA: Diagnosis not present

## 2023-12-31 DIAGNOSIS — M216X1 Other acquired deformities of right foot: Secondary | ICD-10-CM | POA: Diagnosis not present

## 2023-12-31 DIAGNOSIS — G8918 Other acute postprocedural pain: Secondary | ICD-10-CM | POA: Diagnosis not present

## 2023-12-31 DIAGNOSIS — M62461 Contracture of muscle, right lower leg: Secondary | ICD-10-CM | POA: Diagnosis not present

## 2023-12-31 MED ORDER — OXYCODONE-ACETAMINOPHEN 5-325 MG PO TABS
1.0000 | ORAL_TABLET | ORAL | 0 refills | Status: DC | PRN
  Filled 2023-12-31: qty 30, 5d supply, fill #0

## 2023-12-31 MED ORDER — IBUPROFEN 800 MG PO TABS
800.0000 mg | ORAL_TABLET | Freq: Four times a day (QID) | ORAL | 1 refills | Status: AC | PRN
  Filled 2023-12-31: qty 60, 15d supply, fill #0

## 2024-01-08 ENCOUNTER — Ambulatory Visit (INDEPENDENT_AMBULATORY_CARE_PROVIDER_SITE_OTHER): Admitting: Podiatry

## 2024-01-08 DIAGNOSIS — M62461 Contracture of muscle, right lower leg: Secondary | ICD-10-CM

## 2024-01-08 DIAGNOSIS — M722 Plantar fascial fibromatosis: Secondary | ICD-10-CM

## 2024-01-08 NOTE — Progress Notes (Unsigned)
 Subjective:  Patient ID: Shannon George, female    DOB: [birth year],  MRN: [id number]  Chief Complaint  Patient presents with   Routine Post Op    POV # 1 DOS 12/31/23 RT EPF W/ GASTROCNEIMUS RECESSION    DOS: 12/31/2023 Procedure: Right endoscopic plantar fasciotomy with right gastrocnemius recession  43 y.o. female returns for post-op check.  She states she is doing well pain is controlled.  Bandages clean dry and intact nonweightbearing to the right lower extremity however she has been weightbearing as tolerated in cam boot denies any other acute issues.  Review of Systems: Negative except as noted in the HPI. Denies N/V/F/Ch.  Past Medical History:  Diagnosis Date   BRCA1 gene mutation positive 2014   BRCA 1 valine substitution error. [facility]   Family history of breast cancer    GERD (gastroesophageal reflux disease) 2022   History of Papanicolaou smear of cervix 04/24/13; 06/04/14   -/+; neg   Migraine    Pap smear abnormality of cervix with ASCUS favoring dysplasia    Vitamin D  deficiency     Current Outpatient Medications:    estradiol  (ESTRACE ) 1 MG tablet, Take 1 tablet (1 mg total) by mouth daily., Disp: 90 tablet, Rfl: 3   famotidine  (PEPCID ) 20 MG tablet, Take 1 tablet (20 mg total) by mouth 2 (two) times daily., Disp: 90 tablet, Rfl: 3   Halobetasol Prop-Tazarotene (DUOBRII ) 0.01-0.045 % LOTN, Apply to aa's psoriasis on the elbows QD. Once plaques have thinned switch back to Triamcinolone  QD PRN flares., Disp: 100 g, Rfl: 6   ibuprofen  (ADVIL ) 800 MG tablet, Take 1 tablet (800 mg total) by mouth every 8 (eight) hours as needed., Disp: 21 tablet, Rfl: 0   ibuprofen  (ADVIL ) 800 MG tablet, Take 1 tablet (800 mg total) by mouth every 6 (six) hours as needed., Disp: 60 tablet, Rfl: 1   LORazepam  (ATIVAN ) 0.5 MG tablet, Take 1 tablet (0.5 mg total) by mouth every 8 (eight) hours as needed for anxiety., Disp: 30 tablet, Rfl: 0   oxyCODONE -acetaminophen  (PERCOCET) 5-325  MG tablet, Take 1 tablet by mouth every 4 (four) hours as needed for severe pain (pain score 7-10)., Disp: 30 tablet, Rfl: 0   progesterone  (PROMETRIUM ) 200 MG capsule, Take 1 capsule (200 mg total) by mouth Nightly 6 nights on, 1 night off., Disp: 90 capsule, Rfl: 3   SUMAtriptan  (IMITREX ) 100 MG tablet, Take 1 tablet (100 mg total) by mouth every 2 (two) hours as needed for migraine. May repeat in 2 hours if headache persists or recurs., Disp: 5 tablet, Rfl: 5   traZODone  (DESYREL ) 150 MG tablet, Take 1 tablet (150 mg total) by mouth at bedtime., Disp: 90 tablet, Rfl: 2  Social History   Tobacco Use  Smoking Status Never  Smokeless Tobacco Never    Allergies  Allergen Reactions   Amoxicillin Rash    Did it involve swelling of the face/tongue/throat, SOB, or low BP? No Did it involve sudden or severe rash/hives, skin peeling, or any reaction on the inside of your mouth or nose? No Did you need to seek medical attention at a hospital or doctor's office? No When did it last happen?      10+ years ago   Penicillins Rash and Other (See Comments)    Did it involve swelling of the face/tongue/throat, SOB, or low BP? No  Did it involve sudden or severe rash/hives, skin peeling, or any reaction on the inside of your mouth or nose?  No  Did you need to seek medical attention at a hospital or doctor's office? No  When did it last happen?      10+ years ago  If all above answers are "NO", may proceed with cephalosporin use.  Did it involve swelling of the face/tongue/throat, SOB, or low BP? No Did it involve sudden or severe rash/hives, skin peeling, or any reaction on the inside of your mouth or nose? No Did you need to seek medical attention at a hospital or doctor's office? No When did it last happen?      10+ years ago    Did it involve swelling of the face/tongue/throat, SOB, or low BP? No Did it involve sudden or severe rash/hives, skin peeling, or any reaction on the inside of your mouth  or nose? No Did you need to seek medical attention at a hospital or doctor's office? No When did it last happen?      10+ years ago If all above answers are "NO", may proceed with cephalosporin use.   Objective:  There were no vitals filed for this visit. There is no height or weight on file to calculate BMI. Constitutional Well developed. Well nourished.  Vascular Foot warm and well perfused. Capillary refill normal to all digits.   Neurologic Normal speech. Oriented to person, place, and time. Epicritic sensation to light touch grossly present bilaterally.  Dermatologic Skin healing well without signs of infection. Skin edges well coapted without signs of infection.  Orthopedic: Tenderness to palpation noted about the surgical site.   Radiographs: None Assessment:   1. Plantar fasciitis, right   2. Gastrocnemius equinus, right    Plan:  Patient was evaluated and treated and all questions answered.  S/p foot surgery right -Progressing as expected post-operatively. -XR: See above -WB Status weightbearing as tolerated in cam boot -Sutures: Intact.  No clinical signs of Deis and no no complication noted. -Medications: None -Foot redressed.  No follow-ups on file.

## 2024-01-24 ENCOUNTER — Ambulatory Visit (INDEPENDENT_AMBULATORY_CARE_PROVIDER_SITE_OTHER): Admitting: Podiatry

## 2024-01-24 DIAGNOSIS — M21961 Unspecified acquired deformity of right lower leg: Secondary | ICD-10-CM

## 2024-01-24 DIAGNOSIS — M722 Plantar fascial fibromatosis: Secondary | ICD-10-CM

## 2024-01-24 DIAGNOSIS — M62461 Contracture of muscle, right lower leg: Secondary | ICD-10-CM

## 2024-01-24 DIAGNOSIS — M21962 Unspecified acquired deformity of left lower leg: Secondary | ICD-10-CM

## 2024-01-24 NOTE — Progress Notes (Signed)
 Subjective:  Patient ID: Shannon George, female    DOB: 01-25-1981,  MRN: [id number]  Chief Complaint  Patient presents with   Routine Post Op    POV # 2 DOS 12/31/23 RT EPF W/ GASTROCNEIMUS RECESSION    DOS: 12/31/2023 Procedure: Right endoscopic plantar fasciotomy with right gastrocnemius recession  43 y.o. female returns for post-op check.  She states she is doing well pain is controlled.  Bandages clean dry and intact nonweightbearing to the right lower extremity however she has been weightbearing as tolerated in cam boot denies any other acute issues.  Review of Systems: Negative except as noted in the HPI. Denies N/V/F/Ch.  Past Medical History:  Diagnosis Date   BRCA1 gene mutation positive 2014   BRCA 1 valine substitution error. [facility]   Family history of breast cancer    GERD (gastroesophageal reflux disease) 2022   History of Papanicolaou smear of cervix 04/24/13; 06/04/14   -/+; neg   Migraine    Pap smear abnormality of cervix with ASCUS favoring dysplasia    Vitamin D  deficiency     Current Outpatient Medications:    estradiol  (ESTRACE ) 1 MG tablet, Take 1 tablet (1 mg total) by mouth daily., Disp: 90 tablet, Rfl: 3   famotidine  (PEPCID ) 20 MG tablet, Take 1 tablet (20 mg total) by mouth 2 (two) times daily., Disp: 90 tablet, Rfl: 3   Halobetasol Prop-Tazarotene (DUOBRII ) 0.01-0.045 % LOTN, Apply to aa's psoriasis on the elbows QD. Once plaques have thinned switch back to Triamcinolone  QD PRN flares., Disp: 100 g, Rfl: 6   ibuprofen  (ADVIL ) 800 MG tablet, Take 1 tablet (800 mg total) by mouth every 8 (eight) hours as needed., Disp: 21 tablet, Rfl: 0   ibuprofen  (ADVIL ) 800 MG tablet, Take 1 tablet (800 mg total) by mouth every 6 (six) hours as needed., Disp: 60 tablet, Rfl: 1   LORazepam  (ATIVAN ) 0.5 MG tablet, Take 1 tablet (0.5 mg total) by mouth every 8 (eight) hours as needed for anxiety., Disp: 30 tablet, Rfl: 0   oxyCODONE -acetaminophen  (PERCOCET) 5-325  MG tablet, Take 1 tablet by mouth every 4 (four) hours as needed for severe pain (pain score 7-10)., Disp: 30 tablet, Rfl: 0   progesterone  (PROMETRIUM ) 200 MG capsule, Take 1 capsule (200 mg total) by mouth Nightly 6 nights on, 1 night off., Disp: 90 capsule, Rfl: 3   SUMAtriptan  (IMITREX ) 100 MG tablet, Take 1 tablet (100 mg total) by mouth every 2 (two) hours as needed for migraine. May repeat in 2 hours if headache persists or recurs., Disp: 5 tablet, Rfl: 5   traZODone  (DESYREL ) 150 MG tablet, Take 1 tablet (150 mg total) by mouth at bedtime., Disp: 90 tablet, Rfl: 2  Social History   Tobacco Use  Smoking Status Never  Smokeless Tobacco Never    Allergies  Allergen Reactions   Amoxicillin Rash    Did it involve swelling of the face/tongue/throat, SOB, or low BP? No Did it involve sudden or severe rash/hives, skin peeling, or any reaction on the inside of your mouth or nose? No Did you need to seek medical attention at a hospital or doctor's office? No When did it last happen?      10+ years ago   Penicillins Rash and Other (See Comments)    Did it involve swelling of the face/tongue/throat, SOB, or low BP? No  Did it involve sudden or severe rash/hives, skin peeling, or any reaction on the inside of your mouth or nose?  No  Did you need to seek medical attention at a hospital or doctor's office? No  When did it last happen?      10+ years ago  If all above answers are "NO", may proceed with cephalosporin use.  Did it involve swelling of the face/tongue/throat, SOB, or low BP? No Did it involve sudden or severe rash/hives, skin peeling, or any reaction on the inside of your mouth or nose? No Did you need to seek medical attention at a hospital or doctor's office? No When did it last happen?      10+ years ago    Did it involve swelling of the face/tongue/throat, SOB, or low BP? No Did it involve sudden or severe rash/hives, skin peeling, or any reaction on the inside of your mouth  or nose? No Did you need to seek medical attention at a hospital or doctor's office? No When did it last happen?      10+ years ago If all above answers are "NO", may proceed with cephalosporin use.   Objective:  There were no vitals filed for this visit. There is no height or weight on file to calculate BMI. Constitutional Well developed. Well nourished.  Vascular Foot warm and well perfused. Capillary refill normal to all digits.   Neurologic Normal speech. Oriented to person, place, and time. Epicritic sensation to light touch grossly present bilaterally.  Dermatologic Skin healing well without signs of infection. Skin edges well coapted without signs of infection.  Orthopedic: Tenderness to palpation noted about the surgical site.   Radiographs: None Assessment:   1. Foot deformity, bilateral   2. Plantar fasciitis, right   3. Gastrocnemius equinus, right     Plan:  Patient was evaluated and treated and all questions answered.  S/p foot surgery right - Clinically healed and officially discharged from my care if any foot and ankle issues are in the future she will come back and see me.  I encouraged shoe gear modification as well as orthotics management she would benefit from orthotics.  She agrees with the plan  Pes planovalgus/foot deformity -I explained to patient the etiology of pes planovalgus and relationship with heel pain/arch pain and various treatment options were discussed.  Given patient foot structure in the setting of heel pain/arch pain I believe patient will benefit from custom-made orthotics to help control the hindfoot motion support the arch of the foot and take the stress away from arches.  Patient agrees with the plan like to proceed with orthotics -Patient was casted for orthotics   No follow-ups on file.

## 2024-02-27 ENCOUNTER — Telehealth: Payer: Self-pay

## 2024-02-27 NOTE — Telephone Encounter (Signed)
 Orthotics are here Balance $84.18 Financial form signed and on file Appt needed for fitting/ pu

## 2024-02-29 NOTE — Telephone Encounter (Signed)
 LVM to schedule orthotic fitting/ pu  Orthotics in [place] bin

## 2024-03-04 ENCOUNTER — Other Ambulatory Visit

## 2024-03-07 ENCOUNTER — Ambulatory Visit (INDEPENDENT_AMBULATORY_CARE_PROVIDER_SITE_OTHER): Admitting: Podiatry

## 2024-03-07 DIAGNOSIS — M722 Plantar fascial fibromatosis: Secondary | ICD-10-CM

## 2024-03-07 DIAGNOSIS — Q666 Other congenital valgus deformities of feet: Secondary | ICD-10-CM

## 2024-03-07 DIAGNOSIS — M21962 Unspecified acquired deformity of left lower leg: Secondary | ICD-10-CM

## 2024-03-07 DIAGNOSIS — M21961 Unspecified acquired deformity of right lower leg: Secondary | ICD-10-CM

## 2024-03-07 NOTE — Progress Notes (Signed)
 Patient of Dr. Tobie comes in today to pick up her custom molded orthotics. She was not aware that she had a balance of $84.18 and was a little upset about this. She was able to pay the balance in full today. Orthotics were fitted to the sneakers she was wearing today. Advised to break them in slowly. Written instructions were given to the patient. She will call the office with any questions or problems.

## 2024-03-25 ENCOUNTER — Other Ambulatory Visit: Payer: Self-pay

## 2024-03-25 ENCOUNTER — Encounter: Payer: Self-pay | Admitting: Certified Nurse Midwife

## 2024-03-25 ENCOUNTER — Ambulatory Visit (INDEPENDENT_AMBULATORY_CARE_PROVIDER_SITE_OTHER): Admitting: Certified Nurse Midwife

## 2024-03-25 VITALS — BP 108/71 | HR 70 | Ht 64.0 in | Wt 221.8 lb

## 2024-03-25 DIAGNOSIS — E669 Obesity, unspecified: Secondary | ICD-10-CM

## 2024-03-25 DIAGNOSIS — Z7689 Persons encountering health services in other specified circumstances: Secondary | ICD-10-CM

## 2024-03-25 DIAGNOSIS — Z6838 Body mass index (BMI) 38.0-38.9, adult: Secondary | ICD-10-CM | POA: Diagnosis not present

## 2024-03-25 DIAGNOSIS — Z713 Dietary counseling and surveillance: Secondary | ICD-10-CM

## 2024-03-25 MED ORDER — CYANOCOBALAMIN 1000 MCG/ML IJ SOLN
1000.0000 ug | Freq: Once | INTRAMUSCULAR | Status: AC
  Administered 2024-03-25: 1000 ug via INTRAMUSCULAR

## 2024-03-25 MED ORDER — MOUNJARO 2.5 MG/0.5ML ~~LOC~~ SOAJ
2.5000 mg | SUBCUTANEOUS | 0 refills | Status: DC
  Filled 2024-03-25: qty 2, 28d supply, fill #0

## 2024-03-25 MED ORDER — PHENTERMINE HCL 37.5 MG PO TABS
37.5000 mg | ORAL_TABLET | Freq: Every day | ORAL | 0 refills | Status: DC
  Filled 2024-03-25: qty 30, 30d supply, fill #0

## 2024-03-25 NOTE — Addendum Note (Signed)
 Addended by: DELANA SAUER on: 03/25/2024 03:50 PM   Modules accepted: Orders

## 2024-03-25 NOTE — Addendum Note (Signed)
 Addended by: SEBASTIAN ZELDA HERO on: 03/25/2024 04:01 PM   Modules accepted: Orders

## 2024-03-25 NOTE — Progress Notes (Addendum)
 Subjective:  Shannon George is a 43 y.o. G2P2002 at [facility] being seen today for weight loss management- initial visit.  Patient reports General ROS: negative and reports previous weight loss attempts:  Onset gradual .  Onset followed: poor eating habits  and pt family member in poor health.  Associated symptoms include: fatigue, depression,  change in clothing fit .  Pertinent medical history includes: none  Risk factors include: none  The patient has a surgical history of: cesarean section, ovaries removed Pertinent social history includes: none Past evaluation has included: metabolic profile, hemoglobin A1c, thyroid  panel   Past treatment has included: small frequent feedings, GLPI exercise management and diet changes   The following portions of the patient's history were reviewed and updated as appropriate: allergies, current medications, past family history, past medical history, past social history, past surgical history and problem list.   Objective:   Vitals:   03/25/24 1456  BP: 108/71  Pulse: 70  Weight: 221 lb 12.8 oz (100.6 kg)  Height: 5' 4 (1.626 m)    General:  Alert, oriented and cooperative. Patient is in no acute distress.  PE: Well groomed female in no current distress,   Mental Status: Normal mood and affect. Normal behavior. Normal judgment and thought content.   Current BMI: Body mass index is 38.07 kg/m.   Assessment and Plan:  Obesity  Pt interested in use of GLP1 medication. If not covered discussed use of phentermine . She is in agreement and denies contraindications to use of medication.   Pt called stated medication $600 out of pocket . Would like to use the phentermine . Order placed. Controlled substance agreement signed.   Her goal is to be in 160s.   Recommend exercise 3-4 x week for 30- 60  minutes.   Plan: low carb, High protein diet RX for adipex 37.5 mg daily and B12 1000mcg.ml monthly, to start now with first injection given at today's  visit. Reviewed side-effects common to both medications and expected outcomes. Increase daily water intake to at least 8 bottle a day, every day.  Goal is to reduse weight by 10% by end of three months, and will re-evaluate then.  RTC in 4 weeks for Nurse visit to check weight & BP, and get next B12 injections.    Please refer to After Visit Summary for other counseling recommendations.    Sebastian Sham, CNM    Consider the Low Glycemic Index Diet and 6 smaller meals daily .  This boosts your metabolism and regulates your sugars:   Use the protein bar by [employer] because they have lots of fiber in them  Find the low carb flatbreads, tortillas and pita breads for sandwiches:  Joseph's makes a pita bread and a flat bread , available at [facility] and [employer]; Toufayah makes a low carb flatbread available at [employer] and [employer] that is 9 net carbs and 100 cal [employer] makes a low carb whole wheat tortilla available at [employer] most grocery stores with 6 net carbs and 210 cal  Greek yogurt can still have a lot of carbs .  Dannon Light N fit has 80 cal and 8 carbs

## 2024-04-14 NOTE — Progress Notes (Unsigned)
 PCP:  Leigh Sober, MD   No chief complaint on file.    HPI:      Shannon George is a 43 y.o. [phone] who LMP was No LMP recorded. Patient has had an ablation.ayesha, presents today for her annual examination.  Her menses are absent due to endometrial ablation 2013/lap BSO 12/20 due to BRCA 1 mutation. No vaginal bleeding/pelvic pain. On estradiol  1 mg and prometrium  200 mg daily, occas vasomotor sx.   Sex activity: single partner, contraception - tubal ligation/BSO. Does have vaginal dryness, lubricants irritate her more, improved with coconut oil.  Last Pap: 04/17/23 Results were NILM/POS HPV DNA, colpo recommended but not done.  04/11/22 Results were NILM/neg HPV DNA.  04/07/21 Results were: no abnormalities /POS HPV DNA ; repeat pap due today Hx of STDs: HPV on cx/LEEP 2001  Last mammogram: 06/12/23 Results were normal, repeat in 12 months. Had neg breast MRI 8/24. There is a FH of breast cancer in her mom, MGM, and pat aunt. There is no FH of ovarian cancer.  PT IS BRCA 1 POS. The patient does do self-breast exams. She is doing yearly mammo and screening breast MRI (done 8/24), as well as Q6 months CBE. She is taking Vit D supp 10,000 IU daily due to deficiency in past, low-normal levels 7/24 on 5000IU but gets too high with 10,000IU long term. Is s/p BSO; aware of prophylactic mastectomy and tamoxifen recommendations.  No FH pancreatic cancer.   Tobacco use: The patient denies current or previous tobacco use. Alcohol use: none No drug use.  Exercise: min active  She does get adequate calcium and Vitamin D  in her diet.  Normal fasting labs 12/24 except early pre-DM; due for repeat today  Takes lorazepam  prn occas, doesn't need RF. Rarely takes trazodone  to sleep, no RF needed today.   Has been having urinary urgency with and without good flow, as well as urge incont after driving home from work for the past month. Had pelvic pressure yesterday. Not drinking any caffeine; good  water intake. No hematuria, dysuria, LBP. Hx of UTI in past.   Past Medical History:  Diagnosis Date   BRCA1 gene mutation positive 2014   BRCA 1 valine substitution error. [facility]   Family history of breast cancer    GERD (gastroesophageal reflux disease) 2022   History of Papanicolaou smear of cervix 04/24/13; 06/04/14   -/+; neg   Migraine    Pap smear abnormality of cervix with ASCUS favoring dysplasia    Vitamin D  deficiency     Past Surgical History:  Procedure Laterality Date   BREAST BIOPSY Left 06/20/2017   FIBROCYSTIC CHANGE of MRI bx, dumbell marker   CERVICAL BIOPSY  W/ LOOP ELECTRODE EXCISION  2001   [facility]   CESAREAN SECTION  2006, 2008   LAPAROSCOPIC BILATERAL SALPINGECTOMY Bilateral 04/24/2019   Procedure: LAPAROSCOPIC BILATERAL SALPINGO- OOPHORECTOMY;  Surgeon: Lake Read, MD;  Location: [facility] ORS;  Service: Gynecology;  Laterality: Bilateral;   NOVASURE ABLATION  2013   [facility]   TUBAL LIGATION  2008    Family History  Problem Relation Age of Onset   Cancer Father 70       lung   Hypertension Mother    Breast cancer Mother        36 (medullary carcinoma, poorly differentiated) and 50   Breast cancer Paternal Aunt 64   Breast cancer Maternal Grandmother 16   Cancer Paternal Grandmother  BLADDER; KIDNEY   Cancer Maternal Grandfather        STOMACH    Social History   Socioeconomic History   Marital status: Married    Spouse name: [name]   Number of children: 2   Years of education: 13   Highest education level: Not on file  Occupational History   Occupation: [employer]    Comment: [employer]  Tobacco Use   Smoking status: Never   Smokeless tobacco: Never  Vaping Use   Vaping status: Never Used  Substance and Sexual Activity   Alcohol use: No    Alcohol/week: 0.0 standard drinks of alcohol   Drug use: No   Sexual activity: Yes    Birth control/protection: Surgical    Comment: Tubal Ligation  Other Topics Concern    Not on file  Social History Narrative   Not on file   Social Drivers of Health   Financial Resource Strain: Not on file  Food Insecurity: Not on file  Transportation Needs: Not on file  Physical Activity: Insufficiently Active (08/06/2023)   Received from [facility]   Exercise Vital Sign    On average, how many days per week do you engage in moderate to strenuous exercise (like a brisk walk)?: 3 days    On average, how many minutes do you engage in exercise at this level?: 30 min  Stress: Not on file  Social Connections: Not on file  Intimate Partner Violence: Not on file    No outpatient medications have been marked as taking for the 04/17/24 encounter (Appointment) with Coleson Kant, Bernarda B, PA-C.     ROS:  Review of Systems  Constitutional:  Negative for fatigue, fever and unexpected weight change.  Respiratory:  Negative for cough, shortness of breath and wheezing.   Cardiovascular:  Negative for chest pain, palpitations and leg swelling.  Gastrointestinal:  Negative for blood in stool, constipation, diarrhea, nausea and vomiting.  Endocrine: Negative for cold intolerance, heat intolerance and polyuria.  Genitourinary:  Positive for urgency. Negative for dyspareunia, dysuria, flank pain, frequency, genital sores, hematuria, menstrual problem, pelvic pain, vaginal bleeding, vaginal discharge and vaginal pain.  Musculoskeletal:  Negative for back pain, joint swelling and myalgias.  Skin:  Negative for rash.  Neurological:  Negative for dizziness, syncope, light-headedness, numbness and headaches.  Hematological:  Negative for adenopathy.  Psychiatric/Behavioral:  Negative for agitation, confusion, sleep disturbance and suicidal ideas. The patient is not nervous/anxious.      Objective: There were no vitals taken for this visit.   Physical Exam Constitutional:      Appearance: She is well-developed.  Genitourinary:     Vulva normal.     Right Labia: No  rash, tenderness or lesions.    Left Labia: No tenderness, lesions or rash.    No vaginal discharge, erythema or tenderness.      Right Adnexa: absent.    Right Adnexa: not tender and no mass present.    Left Adnexa: absent.    Left Adnexa: not tender and no mass present.    No cervical friability or polyp.     Uterus is not enlarged or tender.  Breasts:    Right: No mass, nipple discharge, skin change or tenderness.     Left: No mass, nipple discharge, skin change or tenderness.  Neck:     Thyroid : No thyromegaly.  Cardiovascular:     Rate and Rhythm: Normal rate and regular rhythm.     Heart sounds: Normal heart  sounds. No murmur heard. Pulmonary:     Effort: Pulmonary effort is normal.     Breath sounds: Normal breath sounds.  Abdominal:     Palpations: Abdomen is soft.     Tenderness: There is no abdominal tenderness. There is no guarding or rebound.  Musculoskeletal:        General: Normal range of motion.     Cervical back: Normal range of motion.  Lymphadenopathy:     Cervical: No cervical adenopathy.  Neurological:     General: No focal deficit present.     Mental Status: She is alert and oriented to person, place, and time.     Cranial Nerves: No cranial nerve deficit.  Skin:    General: Skin is warm and dry.  Psychiatric:        Mood and Affect: Mood normal.        Behavior: Behavior normal.        Thought Content: Thought content normal.        Judgment: Judgment normal.  Vitals reviewed.    No results found for this or any previous visit (from the past 24 hours).    Assessment/Plan: Encounter for annual routine gynecological examination  Cervical cancer screening - Plan: Cytology - PAP  Screening for HPV (human papillomavirus) - Plan: Cytology - PAP  Cervical high risk human papillomavirus (HPV) DNA test positive - Plan: Cytology - PAP; repeat pap today, will f/u if abn.   Encounter for screening mammogram for malignant neoplasm of breast - Plan: MM  3D SCREENING MAMMOGRAM BILATERAL BREAST; pt to schedule mammo 2/25  Family history of breast cancer - Plan: MM 3D SCREENING MAMMOGRAM BILATERAL BREAST  BRCA1 gene mutation positive - Plan: MM 3D SCREENING MAMMOGRAM BILATERAL BREAST; pt aware of monthly SBE, Q6 mo CBE, yearly mammo and scr breast MRI. Will do MRI ref after mammo. Cont Vit D supp--change to 7500 mg dose now since can get too high with 10,000IU and too low with 5,000IU. Pt also aware of option for prophylactic mastectomy vs tamoxifen. Pt is s/p BSO. No FH pancreatic ca.   Increased risk of breast cancer  Vitamin D  deficiency - Plan: VITAMIN D  25 Hydroxy (Vit-D Deficiency, Fractures); change to 7500 IUDaily (can alternate 5000 and 10,000 every other day), recheck labs 6 months.   Vasomotor symptoms due to menopause - Plan: estradiol  (ESTRACE ) 1 MG tablet, progesterone  (PROMETRIUM ) 200 MG capsule; Rx RF, doing well.   Hormone replacement therapy (HRT) - Plan: estradiol  (ESTRACE ) 1 MG tablet, progesterone  (PROMETRIUM ) 200 MG capsule  Anxiety--will call for ativan  RF prn. Takes very sparingly.   Blood tests for routine general physical examination - Plan: Comprehensive metabolic panel, Hemoglobin A1c, Lipid panel  Screening cholesterol level - Plan: Lipid panel  Screening for diabetes mellitus - Plan: Hemoglobin A1c  Acute cystitis with hematuria - Plan: nitrofurantoin , macrocrystal-monohydrate, (MACROBID ) 100 MG capsule, POCT Urinalysis Dipstick; pos sx and UA. Rx macrobid , Check C&S. F/u prn.    No orders of the defined types were placed in this encounter.           GYN counsel breast self exam, mammography screening, adequate intake of calcium and vitamin D , diet and exercise     F/U  No follow-ups on file./ 6 months CBE  Thoams Siefert B. Noble Bodie, PA-C 04/14/2024 5:35 PM

## 2024-04-17 ENCOUNTER — Ambulatory Visit: Admitting: Obstetrics and Gynecology

## 2024-04-17 ENCOUNTER — Other Ambulatory Visit: Payer: Self-pay

## 2024-04-17 ENCOUNTER — Encounter: Payer: Self-pay | Admitting: Obstetrics and Gynecology

## 2024-04-17 ENCOUNTER — Other Ambulatory Visit (HOSPITAL_COMMUNITY)
Admission: RE | Admit: 2024-04-17 | Discharge: 2024-04-17 | Disposition: A | Source: Ambulatory Visit | Attending: Obstetrics and Gynecology | Admitting: Obstetrics and Gynecology

## 2024-04-17 VITALS — BP 123/63 | HR 108 | Ht 64.0 in | Wt 215.0 lb

## 2024-04-17 DIAGNOSIS — Z13220 Encounter for screening for lipoid disorders: Secondary | ICD-10-CM

## 2024-04-17 DIAGNOSIS — N3946 Mixed incontinence: Secondary | ICD-10-CM | POA: Diagnosis not present

## 2024-04-17 DIAGNOSIS — Z124 Encounter for screening for malignant neoplasm of cervix: Secondary | ICD-10-CM

## 2024-04-17 DIAGNOSIS — Z79890 Hormone replacement therapy (postmenopausal): Secondary | ICD-10-CM | POA: Diagnosis not present

## 2024-04-17 DIAGNOSIS — Z9189 Other specified personal risk factors, not elsewhere classified: Secondary | ICD-10-CM

## 2024-04-17 DIAGNOSIS — R87810 Cervical high risk human papillomavirus (HPV) DNA test positive: Secondary | ICD-10-CM

## 2024-04-17 DIAGNOSIS — Z803 Family history of malignant neoplasm of breast: Secondary | ICD-10-CM | POA: Diagnosis not present

## 2024-04-17 DIAGNOSIS — Z0000 Encounter for general adult medical examination without abnormal findings: Secondary | ICD-10-CM

## 2024-04-17 DIAGNOSIS — E559 Vitamin D deficiency, unspecified: Secondary | ICD-10-CM

## 2024-04-17 DIAGNOSIS — Z01419 Encounter for gynecological examination (general) (routine) without abnormal findings: Secondary | ICD-10-CM

## 2024-04-17 DIAGNOSIS — Z15068 Genetic susceptibility to other malignant neoplasm of digestive system: Secondary | ICD-10-CM

## 2024-04-17 DIAGNOSIS — N951 Menopausal and female climacteric states: Secondary | ICD-10-CM

## 2024-04-17 DIAGNOSIS — R7303 Prediabetes: Secondary | ICD-10-CM

## 2024-04-17 DIAGNOSIS — Z1151 Encounter for screening for human papillomavirus (HPV): Secondary | ICD-10-CM

## 2024-04-17 DIAGNOSIS — Z1509 Genetic susceptibility to other malignant neoplasm: Secondary | ICD-10-CM

## 2024-04-17 DIAGNOSIS — Z1231 Encounter for screening mammogram for malignant neoplasm of breast: Secondary | ICD-10-CM

## 2024-04-17 DIAGNOSIS — Z1501 Genetic susceptibility to malignant neoplasm of breast: Secondary | ICD-10-CM | POA: Diagnosis not present

## 2024-04-17 DIAGNOSIS — Z90710 Acquired absence of both cervix and uterus: Secondary | ICD-10-CM | POA: Diagnosis not present

## 2024-04-17 MED ORDER — PROGESTERONE 200 MG PO CAPS
200.0000 mg | ORAL_CAPSULE | Freq: Every evening | ORAL | 3 refills | Status: AC
  Filled 2024-04-17: qty 90, 90d supply, fill #0

## 2024-04-17 MED ORDER — ESTRADIOL 1 MG PO TABS
1.0000 mg | ORAL_TABLET | Freq: Every day | ORAL | 3 refills | Status: AC
  Filled 2024-04-17: qty 90, 90d supply, fill #0

## 2024-04-17 NOTE — Patient Instructions (Signed)
 I value your feedback and you entrusting [place] with your care. If you get a [facility] patient survey, I would appreciate you taking the time to let us know about your experience today. Thank you!  [facility] ([place])--[phone]

## 2024-04-18 ENCOUNTER — Other Ambulatory Visit

## 2024-04-18 DIAGNOSIS — Z0000 Encounter for general adult medical examination without abnormal findings: Secondary | ICD-10-CM | POA: Diagnosis not present

## 2024-04-18 DIAGNOSIS — R7303 Prediabetes: Secondary | ICD-10-CM

## 2024-04-18 DIAGNOSIS — Z13220 Encounter for screening for lipoid disorders: Secondary | ICD-10-CM

## 2024-04-18 DIAGNOSIS — E559 Vitamin D deficiency, unspecified: Secondary | ICD-10-CM

## 2024-04-19 LAB — CBC WITH DIFFERENTIAL/PLATELET
Basophils Absolute: 0.1 x10E3/uL (ref 0.0–0.2)
Basos: 1 %
EOS (ABSOLUTE): 0.1 x10E3/uL (ref 0.0–0.4)
Eos: 2 %
Hematocrit: 42.1 % (ref 34.0–46.6)
Hemoglobin: 14.4 g/dL (ref 11.1–15.9)
Immature Grans (Abs): 0 x10E3/uL (ref 0.0–0.1)
Immature Granulocytes: 0 %
Lymphocytes Absolute: 1.4 x10E3/uL (ref 0.7–3.1)
Lymphs: 20 %
MCH: 31.7 pg (ref 26.6–33.0)
MCHC: 34.2 g/dL (ref 31.5–35.7)
MCV: 93 fL (ref 79–97)
Monocytes Absolute: 0.6 x10E3/uL (ref 0.1–0.9)
Monocytes: 9 %
Neutrophils Absolute: 4.7 x10E3/uL (ref 1.4–7.0)
Neutrophils: 68 %
Platelets: 352 x10E3/uL (ref 150–450)
RBC: 4.54 x10E6/uL (ref 3.77–5.28)
RDW: 13.1 % (ref 11.7–15.4)
WBC: 6.9 x10E3/uL (ref 3.4–10.8)

## 2024-04-19 LAB — COMPREHENSIVE METABOLIC PANEL WITH GFR
ALT: 7 IU/L (ref 0–32)
AST: 12 IU/L (ref 0–40)
Albumin: 4.5 g/dL (ref 3.9–4.9)
Alkaline Phosphatase: 72 IU/L (ref 41–116)
BUN/Creatinine Ratio: 17 (ref 9–23)
BUN: 15 mg/dL (ref 6–24)
Bilirubin Total: 0.6 mg/dL (ref 0.0–1.2)
CO2: 25 mmol/L (ref 20–29)
Calcium: 9.7 mg/dL (ref 8.7–10.2)
Chloride: 104 mmol/L (ref 96–106)
Creatinine, Ser: 0.9 mg/dL (ref 0.57–1.00)
Globulin, Total: 2.5 g/dL (ref 1.5–4.5)
Glucose: 87 mg/dL (ref 70–99)
Potassium: 5 mmol/L (ref 3.5–5.2)
Sodium: 142 mmol/L (ref 134–144)
Total Protein: 7 g/dL (ref 6.0–8.5)
eGFR: 81 mL/min/1.73 (ref >59)

## 2024-04-19 LAB — LIPID PANEL
Chol/HDL Ratio: 2.7 ratio (ref 0.0–4.4)
Cholesterol, Total: 154 mg/dL (ref 100–199)
HDL: 57 mg/dL (ref >39)
LDL Chol Calc (NIH): 84 mg/dL (ref 0–99)
Triglycerides: 65 mg/dL (ref 0–149)
VLDL Cholesterol Cal: 13 mg/dL (ref 5–40)

## 2024-04-19 LAB — HEMOGLOBIN A1C
Est. average glucose Bld gHb Est-mCnc: 111 mg/dL
Hgb A1c MFr Bld: 5.5 % (ref 4.8–5.6)

## 2024-04-19 LAB — VITAMIN D 25 HYDROXY (VIT D DEFICIENCY, FRACTURES): Vit D, 25-Hydroxy: 42.3 ng/mL (ref 30.0–100.0)

## 2024-04-21 ENCOUNTER — Ambulatory Visit: Payer: Self-pay | Admitting: Obstetrics and Gynecology

## 2024-04-23 LAB — CYTOLOGY - PAP
Comment: NEGATIVE
Comment: NEGATIVE
Comment: NEGATIVE
Diagnosis: UNDETERMINED — AB
HPV 16: NEGATIVE
HPV 18 / 45: NEGATIVE
High risk HPV: POSITIVE — AB

## 2024-04-29 NOTE — Patient Instructions (Signed)
 Obesity, Adult Obesity is having too much body fat. Being obese means that your weight is more than what is healthy for you.  BMI (body mass index) is a number that explains how much body fat you have. If you have a BMI of 30 or more, you are obese. Obesity can cause serious health problems, such as: Stroke. Coronary artery disease (CAD). Type 2 diabetes. Some types of cancer. High blood pressure (hypertension). High cholesterol. Gallbladder stones. Obesity can also contribute to: Osteoarthritis. Sleep apnea. Infertility problems. What are the causes? Eating meals each day that are high in calories, sugar, and fat. Drinking a lot of drinks that have sugar in them. Being born with genes that may make you more likely to become obese. Having a medical condition that causes obesity. Taking certain medicines. Sitting a lot (having a sedentary lifestyle). Not getting enough sleep. What increases the risk? Having a family history of obesity. Living in an area with limited access to: [place], recreation centers, or sidewalks. Healthy food choices, such as grocery stores and farmers' markets. What are the signs or symptoms? The main sign is having too much body fat. How is this treated? Treatment for this condition often includes changing your lifestyle. Treatment may include: Changing your diet. This may include making a healthy meal plan. Exercise. This may include activity that causes your heart to beat faster (aerobic exercise) and strength training. Work with your doctor to design a program that works for you. Medicine to help you lose weight. This may be used if you are not able to lose one pound a week after 6 weeks of healthy eating and more exercise. Treating conditions that cause the obesity. Surgery. Options may include gastric banding and gastric bypass. This may be done if: Other treatments have not helped to improve your condition. You have a BMI of 40 or higher. You have  life-threatening health problems related to obesity. Follow these instructions at home: Eating and drinking  Follow advice from your doctor about what to eat and drink. Your doctor may tell you to: Limit fast food, sweets, and processed snack foods. Choose low-fat options. For example, choose low-fat milk instead of whole milk. Eat five or more servings of fruits or vegetables each day. Eat at home more often. This gives you more control over what you eat. Choose healthy foods when you eat out. Learn to read food labels. This will help you learn how much food is in one serving. Keep low-fat snacks available. Avoid drinks that have a lot of sugar in them. These include soda, fruit juice, iced tea with sugar, and flavored milk. Drink enough water to keep your pee (urine) pale yellow. Do not go on fad diets. Physical activity Exercise often, as told by your doctor. Most adults should get up to 150 minutes of moderate-intensity exercise every week.Ask your doctor: What types of exercise are safe for you. How often you should exercise. Warm up and stretch before being active. Do slow stretching after being active (cool down). Rest between times of being active. Lifestyle Work with your doctor and a food expert (dietitian) to set a weight-loss goal that is best for you. Limit your screen time. Find ways to reward yourself that do not involve food. Do not drink alcohol if: Your doctor tells you not to drink. You are pregnant, may be pregnant, or are planning to become pregnant. If you drink alcohol: Limit how much you have to: 0-1 drink a day for women. 0-2 drinks  a day for men. Know how much alcohol is in your drink. In the [place] one drink equals one 12 oz bottle of beer (355 mL), one 5 oz glass of wine (148 mL), or one 1 oz glass of hard liquor (44 mL). General instructions Keep a weight-loss journal. This can help you keep track of: The food that you eat. How much exercise you  get. Take over-the-counter and prescription medicines only as told by your doctor. Take vitamins and supplements only as told by your doctor. Think about joining a support group. Pay attention to your mental health as obesity can lead to depression or self esteem issues. Keep all follow-up visits. Contact a doctor if: You cannot meet your weight-loss goal after you have changed your diet and lifestyle for 6 weeks. You are having trouble breathing. Summary Obesity is having too much body fat. Being obese means that your weight is more than what is healthy for you. Work with your doctor to set a weight-loss goal. Get regular exercise as told by your doctor. This information is not intended to replace advice given to you by your health care provider. Make sure you discuss any questions you have with your health care provider. Document Revised: 11/30/2020 Document Reviewed: 11/30/2020 Elsevier Patient Education  2024 [employer].

## 2024-04-29 NOTE — Progress Notes (Signed)
"  ° ° °  ° °  SUBJECTIVE:  43 y.o. here for follow-up weight loss visit, previously seen 4 weeks ago. Denies any concerns and feels like medication is working well. She lost 10 lbs this past month. She States that she felt like it was the decrease in her appetite that really helped her be successful this past month. She denies any negative side effects from medication use and states that she did not really change her activity this month.   OBJECTIVE:  BP 123/81   Pulse 66   Ht 5' 4 (1.626 m)   Wt 210 lb 14.4 oz (95.7 kg)   BMI 36.20 kg/m   Body mass index is 36.2 kg/m. Waist: 37.in  Patient appears well.  ASSESSMENT:  Obesity- responding well to weight loss plan  PLAN:  To continue with current medications. B12 1000mcg/ml injection given  RTC in 4 weeks as planned  Zelda Hummer, CNM "

## 2024-04-30 ENCOUNTER — Other Ambulatory Visit: Payer: Self-pay

## 2024-05-02 ENCOUNTER — Ambulatory Visit: Admitting: Certified Nurse Midwife

## 2024-05-02 ENCOUNTER — Encounter: Payer: Self-pay | Admitting: Certified Nurse Midwife

## 2024-05-02 ENCOUNTER — Other Ambulatory Visit: Payer: Self-pay

## 2024-05-02 VITALS — BP 123/81 | HR 66 | Ht 64.0 in | Wt 210.9 lb

## 2024-05-02 DIAGNOSIS — Z713 Dietary counseling and surveillance: Secondary | ICD-10-CM

## 2024-05-02 DIAGNOSIS — E669 Obesity, unspecified: Secondary | ICD-10-CM

## 2024-05-02 DIAGNOSIS — Z7689 Persons encountering health services in other specified circumstances: Secondary | ICD-10-CM

## 2024-05-02 DIAGNOSIS — Z6836 Body mass index (BMI) 36.0-36.9, adult: Secondary | ICD-10-CM | POA: Diagnosis not present

## 2024-05-02 MED ORDER — CYANOCOBALAMIN 1000 MCG/ML IJ SOLN
1000.0000 ug | Freq: Once | INTRAMUSCULAR | 5 refills | Status: AC
  Filled 2024-05-02: qty 1, 1d supply, fill #0

## 2024-05-02 MED ORDER — PHENTERMINE HCL 37.5 MG PO TABS
37.5000 mg | ORAL_TABLET | Freq: Every day | ORAL | 0 refills | Status: AC
  Filled 2024-05-02: qty 30, 30d supply, fill #0

## 2024-05-02 MED ORDER — CYANOCOBALAMIN 1000 MCG/ML IJ SOLN
1000.0000 ug | Freq: Once | INTRAMUSCULAR | Status: AC
  Administered 2024-05-02: 1000 ug via INTRAMUSCULAR

## 2024-05-20 ENCOUNTER — Ambulatory Visit (INDEPENDENT_AMBULATORY_CARE_PROVIDER_SITE_OTHER): Admitting: Obstetrics & Gynecology

## 2024-05-20 ENCOUNTER — Other Ambulatory Visit (HOSPITAL_COMMUNITY)
Admission: RE | Admit: 2024-05-20 | Discharge: 2024-05-20 | Disposition: A | Discharge status: Home / Self Care | Source: Ambulatory Visit | Attending: Obstetrics & Gynecology | Admitting: Obstetrics & Gynecology

## 2024-05-20 VITALS — BP 118/79 | HR 88 | Wt 210.0 lb

## 2024-05-20 DIAGNOSIS — R87610 Atypical squamous cells of undetermined significance on cytologic smear of cervix (ASC-US): Secondary | ICD-10-CM

## 2024-05-20 DIAGNOSIS — N888 Other specified noninflammatory disorders of cervix uteri: Secondary | ICD-10-CM | POA: Diagnosis not present

## 2024-05-20 DIAGNOSIS — R87810 Cervical high risk human papillomavirus (HPV) DNA test positive: Secondary | ICD-10-CM | POA: Insufficient documentation

## 2024-05-20 NOTE — Progress Notes (Signed)
" ° ° °  GYNECOLOGY PROGRESS NOTE  Subjective:    Patient ID: Shannon George, female    DOB: 1980-06-19, 44 y.o.   MRN: [id number]  HPI  Patient is a 44 y.o. [id number] (79 and 44 yo kids)  here for a colpo due to ASCUS + HR HPV pap 04/2024. Her previous paps were normal cellularity with HR HPV seen in 2022. She reports having a LEEP about 20 years ago but is unable to recall the exact pathology. She has a h/o endometrial ablation and l/s BSO due to + BRCA. She is amenorrheic on HRT.  The following portions of the patient's history were reviewed and updated as appropriate: allergies, current medications, past family history, past medical history, past social history, past surgical history, and problem list.  Review of Systems Pertinent items are noted in HPI.   Objective:   There were no vitals taken for this visit. There is no height or weight on file to calculate BMI. Well nourished, well hydrated White female, no apparent distress She is ambulating and conversing normally. Consent signed, time out done Speculum placed. Cervix prepped with acetic acid. It was stenotic. Transformation zone seen in its entirety. Colpo adequate. Colposcopic findings normal. ECC obtained after gently dilating the cervix. She tolerated the procedure well.    Assessment:   ASCUS + HR HPV pap  Plan:   Await pathology - We discussed that if ECC is negative, then she will need a repeat pap in a year. If abnormal, then I will recommend a LEEP.  "

## 2024-05-22 LAB — SURGICAL PATHOLOGY

## 2024-05-26 ENCOUNTER — Ambulatory Visit: Payer: Self-pay | Admitting: Obstetrics & Gynecology

## 2024-05-26 ENCOUNTER — Encounter: Admitting: Obstetrics & Gynecology

## 2024-06-12 ENCOUNTER — Ambulatory Visit
Admission: RE | Admit: 2024-06-12 | Discharge: 2024-06-12 | Disposition: A | Source: Ambulatory Visit | Attending: Obstetrics and Gynecology

## 2024-06-12 DIAGNOSIS — Z15068 Genetic susceptibility to other malignant neoplasm of digestive system: Secondary | ICD-10-CM

## 2024-06-12 DIAGNOSIS — Z1231 Encounter for screening mammogram for malignant neoplasm of breast: Secondary | ICD-10-CM

## 2024-06-12 DIAGNOSIS — Z803 Family history of malignant neoplasm of breast: Secondary | ICD-10-CM

## 2024-06-12 DIAGNOSIS — Z9189 Other specified personal risk factors, not elsewhere classified: Secondary | ICD-10-CM
